# Patient Record
Sex: Female | Born: 1965 | Race: Black or African American | Hispanic: Refuse to answer | Marital: Single | Smoking: Current every day smoker
Health system: Southern US, Community
[De-identification: ages and names within clinical notes are randomized; demographics above are authoritative.]

## PROBLEM LIST (undated history)

## (undated) DIAGNOSIS — I10 Essential (primary) hypertension: Secondary | ICD-10-CM

## (undated) DIAGNOSIS — T8859XA Other complications of anesthesia, initial encounter: Secondary | ICD-10-CM

## (undated) DIAGNOSIS — T4145XA Adverse effect of unspecified anesthetic, initial encounter: Secondary | ICD-10-CM

## (undated) DIAGNOSIS — D649 Anemia, unspecified: Secondary | ICD-10-CM

## (undated) DIAGNOSIS — M1990 Unspecified osteoarthritis, unspecified site: Secondary | ICD-10-CM

## (undated) DIAGNOSIS — D219 Benign neoplasm of connective and other soft tissue, unspecified: Secondary | ICD-10-CM

## (undated) HISTORY — PX: BACK SURGERY: SHX140

## (undated) HISTORY — PX: TUBAL LIGATION: SHX77

## (undated) HISTORY — DX: Anemia, unspecified: D64.9

## (undated) HISTORY — DX: Benign neoplasm of connective and other soft tissue, unspecified: D21.9

## (undated) HISTORY — PX: CARPAL TUNNEL RELEASE: SHX101

## (undated) HISTORY — DX: Essential (primary) hypertension: I10

---

## 2017-07-12 ENCOUNTER — Encounter: Payer: Self-pay | Admitting: Obstetrics and Gynecology

## 2017-09-09 ENCOUNTER — Encounter: Payer: Self-pay | Admitting: Obstetrics and Gynecology

## 2017-09-09 ENCOUNTER — Other Ambulatory Visit (HOSPITAL_COMMUNITY)
Admission: RE | Admit: 2017-09-09 | Discharge: 2017-09-09 | Disposition: A | Discharge status: Home / Self Care | Payer: BLUE CROSS/BLUE SHIELD | Source: Ambulatory Visit | Attending: Obstetrics and Gynecology | Admitting: Obstetrics and Gynecology

## 2017-09-09 ENCOUNTER — Ambulatory Visit: Payer: BLUE CROSS/BLUE SHIELD | Admitting: Obstetrics and Gynecology

## 2017-09-09 ENCOUNTER — Other Ambulatory Visit: Payer: Self-pay

## 2017-09-09 VITALS — BP 180/100 | HR 96 | Resp 16 | Ht 63.75 in | Wt 184.0 lb

## 2017-09-09 DIAGNOSIS — N926 Irregular menstruation, unspecified: Secondary | ICD-10-CM | POA: Diagnosis not present

## 2017-09-09 DIAGNOSIS — Z23 Encounter for immunization: Secondary | ICD-10-CM

## 2017-09-09 DIAGNOSIS — N921 Excessive and frequent menstruation with irregular cycle: Secondary | ICD-10-CM

## 2017-09-09 DIAGNOSIS — D649 Anemia, unspecified: Secondary | ICD-10-CM

## 2017-09-09 DIAGNOSIS — Z01419 Encounter for gynecological examination (general) (routine) without abnormal findings: Secondary | ICD-10-CM | POA: Insufficient documentation

## 2017-09-09 HISTORY — DX: Anemia, unspecified: D64.9

## 2017-09-09 LAB — POCT URINE PREGNANCY: Preg Test, Ur: NEGATIVE

## 2017-09-09 NOTE — Progress Notes (Signed)
52 y.o. G2P0003 Single African American female here for annual exam.  Patient is having irregular menses.  Monthly menses. Has bleeding for 3 weeks at a time.  Pad change every 15 minutes.  Using 2 pads at a time.  Painful menses.  Ibuprofen does not help.   Started iron after her visit to [facility] Dept[facility] 07/31/17.  No pelvic ultrasound recently.  Had normal pelvic US in [place] about 4 years ago.   Saw at [facility]. and she was given an Rx for hormone, Provera for 10 days.   This helped bleeding somewhat.  Night sweats improved.   Did STD testing at health dept recently.   States her BP is usually elevated at MD's office.  She checks it at home - 120/68.  Just increased to Lisinopril 20 mg daily.  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   PCP:   [facility].   Patient's last menstrual period was 08/31/2017.     Period Cycle (Days): (irregular) Period Duration (Days): 3 weeks per patient  Period Pattern: (!) Irregular Menstrual Flow: Heavy(with clots) Menstrual Control: Maxi pad Menstrual Control Change Freq (Hours): 30 minutes Dysmenorrhea: (!) Severe Dysmenorrhea Symptoms: Cramping, Throbbing     Sexually active: Yes.    The current method of family planning is Tubal ligation.   Exercising: No.  The patient does not participate in regular exercise at present. Smoker:  yes  Health Maintenance: Pap:  About 4 years ago per patient.   History of abnormal Pap:  no MMG:  Scheduled 09/21/17 per patient.   Colonoscopy:  Never.   BMD:   n/a  Result  n/a TDaP:  Not up to date Gardasil:   n/a HIV and Hep C: done in the past -- negative per patient at [facility]: discuss today   reports that she has been smoking cigarettes.  She has been smoking about 0.50 packs per day. she has never used smokeless tobacco. She reports that she drinks about 1.8 oz of alcohol per week. She reports that she does not use  drugs.  Past Medical History:  Diagnosis Date  . Hypertension     Past Surgical History:  Procedure Laterality Date  . CESAREAN SECTION    . TUBAL LIGATION      Current Outpatient Medications  Medication Sig Dispense Refill  . FEROSUL 325 (65 Fe) MG tablet TK 1 T PO BID  1  . ibuprofen (ADVIL,MOTRIN) 800 MG tablet TK 1 T PO Q 8 H PRN  3  . lisinopril (PRINIVIL,ZESTRIL) 20 MG tablet TK 1 T PO D  0   No current facility-administered medications for this visit.     Family History  Problem Relation Age of Onset  . Diabetes Mother   . Hypertension Father     ROS:  Pertinent items are noted in HPI.  Otherwise, a comprehensive ROS was negative.  Exam:   BP (!) 180/100 (BP Location: Right Arm, Patient Position: Sitting, Cuff Size: Large)   Pulse 96   Resp 16   Ht 5' 3.75" (1.619 m)   Wt 184 lb (83.5 kg)   LMP 08/31/2017   BMI 31.83 kg/m     General appearance: alert, cooperative and appears stated age Head: Normocephalic, without obvious abnormality, atraumatic Neck: no adenopathy, supple, symmetrical, trachea midline and thyroid normal to inspection and palpation Lungs: clear to auscultation bilaterally Breasts: normal appearance, no masses or tenderness, No nipple retraction or  dimpling, No nipple discharge or bleeding, No axillary or supraclavicular adenopathy Heart: regular rate and rhythm Abdomen: soft, non-tender; no masses, no organomegaly Extremities: extremities normal, atraumatic, no cyanosis or edema Skin: Skin color, texture, turgor normal. No rashes or lesions Lymph nodes: Cervical, supraclavicular, and axillary nodes normal. No abnormal inguinal nodes palpated Neurologic: Grossly normal  Pelvic: External genitalia:  no lesions              Urethra:  normal appearing urethra with no masses, tenderness or lesions              Bartholins and Skenes: normal                 Vagina: normal appearing vagina with normal color and discharge, no lesions               Cervix: no lesions.  Mild vaginal bleeding.               Pap taken: Yes.   Bimanual Exam:  Uterus:  normal size, contour, position, consistency, mobility, non-tender              Adnexa: no mass, fullness, tenderness              Rectal exam: Yes.  .  Confirms.              Anus:  normal sphincter tone, no lesions  Chaperone was present for exam.  Assessment:   Well woman visit with normal exam. HTN.  Elevated today.  Menorrhagia with irregular menses.  Anemia.  Smoker.   Plan: Mammogram screening discussed. Recommended self breast awareness. Pap and HR HPV as above. Guidelines for Calcium, Vitamin D, regular exercise program including cardiovascular and weight bearing exercise. TSH, CBC.  Return for pelvic ultrasound, sonohysterogram, and EMB. TDap today. I recommended colonoscopy.  She will recheck her BP and call her PCP if it remains elevated.  Follow up annually and prn.   After visit summary provided.

## 2017-09-09 NOTE — Patient Instructions (Signed)

## 2017-09-10 LAB — CBC
HEMOGLOBIN: 10.8 g/dL — AB (ref 11.1–15.9)
Hematocrit: 33.6 % — ABNORMAL LOW (ref 34.0–46.6)
MCH: 25.5 pg — AB (ref 26.6–33.0)
MCHC: 32.1 g/dL (ref 31.5–35.7)
MCV: 79 fL (ref 79–97)
PLATELETS: 250 10*3/uL (ref 150–379)
RBC: 4.23 x10E6/uL (ref 3.77–5.28)
RDW: 21.6 % — ABNORMAL HIGH (ref 12.3–15.4)
WBC: 6.2 10*3/uL (ref 3.4–10.8)

## 2017-09-10 LAB — TSH: TSH: 2.28 u[IU]/mL (ref 0.450–4.500)

## 2017-09-11 ENCOUNTER — Telehealth: Payer: Self-pay | Admitting: Obstetrics and Gynecology

## 2017-09-11 LAB — CYTOLOGY - PAP
DIAGNOSIS: NEGATIVE
HPV (WINDOPATH): NOT DETECTED

## 2017-09-12 ENCOUNTER — Telehealth: Payer: Self-pay | Admitting: *Deleted

## 2017-09-12 NOTE — Telephone Encounter (Signed)
-----   Message from Nunzio Cobbs, MD sent at 09/11/2017  4:56 PM EST ----- Please report labs to patient.  Her hemoglobin is low at 10.8. Her iron level is back up to a normal range, but her ferritin (iron stores) are still low.  She needs to continue her iron twice daily.   Her thyroid test was normal.

## 2017-09-12 NOTE — Telephone Encounter (Signed)
Spoke with patient, advised as seen below per Dr. Quincy Simmonds. Patient verbalizes understanding and is agreeable. Will close encounter.

## 2017-09-12 NOTE — Telephone Encounter (Signed)
Notes recorded by Burnice Logan, RN on 09/12/2017 at 9:48 AM EST Left message to call Sharee Pimple at [phone].

## 2017-09-14 LAB — IRON: IRON: 27 ug/dL (ref 27–159)

## 2017-09-14 LAB — FERRITIN: FERRITIN: 12 ng/mL — AB (ref 15–150)

## 2017-09-14 LAB — SPECIMEN STATUS REPORT

## 2017-09-23 ENCOUNTER — Telehealth: Payer: Self-pay | Admitting: Obstetrics and Gynecology

## 2017-09-23 NOTE — Telephone Encounter (Signed)
Will close encounter

## 2017-09-23 NOTE — Telephone Encounter (Signed)
Patient cancelled ultrasound for 09/26/17. Would like to reschedule to later in the day if possible.

## 2017-09-23 NOTE — Telephone Encounter (Signed)
Ok for appt on 10/03/17.

## 2017-09-23 NOTE — Telephone Encounter (Signed)
Spoke with patient, states she is out of town, flight does not return until 1:30pm on 2/28, requesting to reschedule SHGM, EMB. Patient requesting afternoon appt.   LMP -08/31/17, reports irregular menses, currently on cycle.   SHGM with EMB rescheduled to 10/03/17 at 3pm, consult at 3:30pm with Dr. Quincy Simmonds. Advised patient will update and review with Dr. Quincy Simmonds and return call with any additional recommendations, patient is agreeable.   Dr. Quincy Simmonds, ok to proceed with [facility] and EMB as scheduled?

## 2017-09-26 ENCOUNTER — Other Ambulatory Visit: Payer: BLUE CROSS/BLUE SHIELD | Admitting: Obstetrics and Gynecology

## 2017-09-26 ENCOUNTER — Other Ambulatory Visit: Payer: BLUE CROSS/BLUE SHIELD

## 2017-10-03 ENCOUNTER — Other Ambulatory Visit: Payer: Self-pay

## 2017-10-03 ENCOUNTER — Other Ambulatory Visit: Payer: Self-pay | Admitting: Obstetrics and Gynecology

## 2017-10-03 ENCOUNTER — Ambulatory Visit: Payer: BLUE CROSS/BLUE SHIELD | Admitting: Obstetrics and Gynecology

## 2017-10-03 ENCOUNTER — Encounter: Payer: Self-pay | Admitting: Obstetrics and Gynecology

## 2017-10-03 ENCOUNTER — Ambulatory Visit (INDEPENDENT_AMBULATORY_CARE_PROVIDER_SITE_OTHER): Payer: BLUE CROSS/BLUE SHIELD

## 2017-10-03 VITALS — BP 160/92 | HR 84 | Resp 16 | Wt 180.0 lb

## 2017-10-03 DIAGNOSIS — D649 Anemia, unspecified: Secondary | ICD-10-CM

## 2017-10-03 DIAGNOSIS — N921 Excessive and frequent menstruation with irregular cycle: Secondary | ICD-10-CM

## 2017-10-03 DIAGNOSIS — D219 Benign neoplasm of connective and other soft tissue, unspecified: Secondary | ICD-10-CM | POA: Diagnosis not present

## 2017-10-03 NOTE — Progress Notes (Signed)
Encounter reviewed by Dr. Brook Amundson C. Silva.  

## 2017-10-03 NOTE — Progress Notes (Signed)
GYNECOLOGY  VISIT   HPI: 52 y.o.   Single  African American  female   [id number] with Patient's last menstrual period was 09/01/2017.   here for sonohysterogram/emb for menorrhagia with irregular bleeding.   Declines future childbearing.   Hgb 10.8 on 09/09/17. Stopped iron due to constipation.   Just starting a new anti HTN.  GYNECOLOGIC HISTORY: Patient's last menstrual period was 09/01/2017. Contraception:  Tubal ligation Menopausal hormone therapy:  none Last mammogram:  Not done Last pap smear:   09/09/17 Pap and HR HPV negative        OB History    Gravida Para Term Preterm AB Living   2 2 0 0 0 3   SAB TAB Ectopic Multiple Live Births   0 0 0 1 0         There are no active problems to display for this patient.   Past Medical History:  Diagnosis Date  . Hypertension     Past Surgical History:  Procedure Laterality Date  . CESAREAN SECTION    . TUBAL LIGATION      Current Outpatient Medications  Medication Sig Dispense Refill  . FEROSUL 325 (65 Fe) MG tablet TK 1 T PO BID  1  . ibuprofen (ADVIL,MOTRIN) 800 MG tablet TK 1 T PO Q 8 H PRN  3  . lisinopril (PRINIVIL,ZESTRIL) 20 MG tablet TK 1 T PO D  0  . hydrochlorothiazide (HYDRODIURIL) 25 MG tablet Take 1 tablet by mouth daily.  2   No current facility-administered medications for this visit.      ALLERGIES: Patient has no known allergies.  Family History  Problem Relation Age of Onset  . Diabetes Mother   . Hypertension Father     Social History   Socioeconomic History  . Marital status: Single    Spouse name: Not on file  . Number of children: Not on file  . Years of education: Not on file  . Highest education level: Not on file  Social Needs  . Financial resource strain: Not on file  . Food insecurity - worry: Not on file  . Food insecurity - inability: Not on file  . Transportation needs - medical: Not on file  . Transportation needs - non-medical: Not on file  Occupational History  .  Not on file  Tobacco Use  . Smoking status: Current Every Day Smoker    Packs/day: 0.50    Types: Cigarettes  . Smokeless tobacco: Never Used  Substance and Sexual Activity  . Alcohol use: Yes    Alcohol/week: 1.8 oz    Types: 3 Shots of liquor per week    Comment: occ on weekends  . Drug use: No  . Sexual activity: Yes    Birth control/protection: None  Other Topics Concern  . Not on file  Social History Narrative  . Not on file    ROS:  Pertinent items are noted in HPI.  PHYSICAL EXAMINATION:    BP (!) 160/92 (BP Location: Left Arm, Patient Position: Sitting, Cuff Size: Normal)   Pulse 84   Resp 16   Wt 180 lb (81.6 kg)   LMP 09/01/2017   BMI 31.14 kg/m     General appearance: alert, cooperative and appears stated age   Pelvic US 4 fibroids, one submucous measuring 20 x 14 mm.  EMS 5.89 mm. Normal ovaries.  No free fluid.  Sonohysterogram and EMB Consent for procedures.  Sterile prep with Hibiclens. Cannula placed.  Sterile saline  injected.  Submucous fibroid noted.   EMB Sterile prep with Hibiclens.  Pipelle placed to 6 cm twice.  Tissue to pathology.  No complications.  Minimal EBL.  Chaperone was present for exam.  ASSESSMENT  Uterine fibroids, including submucous fibroid. Status post Cesarean Section.  Status post BTL. HTN.  Not controlled. On new medication.  Anemia. Off Fe.  PLAN  We discussed fibroids and medical versus surgical care.   She declines conservative medical therapy or surgical therapy and desires hyserectomy for definitive tx of fibroids and bleeding.   We discussed laparoscopic hysterectomy with bilateral salpingectomy and bilateral oophorectomy only if the ovaries are abnormal.    I reviewed risks and benefits of surgery.  Risks include but are not limited to bleeding, infection, damage to surrounding organs, pneumonia, reaction to anesthesia, DVT, PE, death, need for reoperation, hernia formation, vaginal cuff  dehiscence.   FU EMB.  Instructions/precautions given.  Take iron with orange juice bid.  She knows she needs to have her BP in control prior to surgery being done.   An After Visit Summary was printed and given to the patient.  __25____ minutes face to face time of which over 50% was spent in counseling.

## 2017-10-03 NOTE — Patient Instructions (Addendum)
Total Laparoscopic Hysterectomy A total laparoscopic hysterectomy is a minimally invasive surgery to remove your uterus and cervix. This surgery is performed by making several small cuts (incisions) in your abdomen. It can also be done with a thin, lighted tube (laparoscope) inserted into two small incisions in your lower abdomen. Your fallopian tubes and ovaries can be removed (bilateral salpingo-oophorectomy) during this surgery as well.Benefits of minimally invasive surgery include:  Less pain.  Less risk of blood loss.  Less risk of infection.  Quicker return to normal activities.  Tell a health care provider about:  Any allergies you have.  All medicines you are taking, including vitamins, herbs, eye drops, creams, and over-the-counter medicines.  Any problems you or family members have had with anesthetic medicines.  Any blood disorders you have.  Any surgeries you have had.  Any medical conditions you have. What are the risks? Generally, this is a safe procedure. However, as with any procedure, complications can occur. Possible complications include:  Bleeding.  Blood clots in the legs or lung.  Infection.  Injury to surrounding organs.  Problems with anesthesia.  Early menopause symptoms (hot flashes, night sweats, insomnia).  Risk of conversion to an open abdominal incision.  What happens before the procedure?  Ask your health care provider about changing or stopping your regular medicines.  Do not take aspirin or blood thinners (anticoagulants) for 1 week before the surgery or as told by your health care provider.  Do not eat or drink anything for 8 hours before the surgery or as told by your health care provider.  Quit smoking if you smoke.  Arrange for a ride home after surgery and for someone to help you at home during recovery. What happens during the procedure?  You will be given antibiotic medicine.  An IV tube will be placed in your arm. You  will be given medicine to make you sleep (general anesthetic).  A gas (carbon dioxide) will be used to inflate your abdomen. This will allow your surgeon to look inside your abdomen, perform your surgery, and treat any other problems found if necessary.  Three or four small incisions (often less than 1/2 inch) will be made in your abdomen. One of these incisions will be made in the area of your belly button (navel). The laparoscope will be inserted into the incision. Your surgeon will look through the laparoscope while doing your procedure.  Other surgical instruments will be inserted through the other incisions.  Your uterus may be removed through your vagina or cut into small pieces and removed through the small incisions.  Your incisions will be closed. What happens after the procedure?  The gas will be released from inside your abdomen.  You will be taken to the recovery area where a nurse will watch and check your progress. Once you are awake, stable, and taking fluids well, without other problems, you will return to your room or be allowed to go home.  There is usually minimal discomfort following the surgery because the incisions are so small.  You will be given pain medicine while you are in the hospital and for when you go home. This information is not intended to replace advice given to you by your health care provider. Make sure you discuss any questions you have with your health care provider. Document Released: 05/13/2007 Document Revised: 12/22/2015 Document Reviewed: 02/03/2013 Elsevier Interactive Patient Education  2017 Elsevier Inc.  Endometrial Biopsy, Care After This sheet gives you information about how to  care for yourself after your procedure. Your health care provider may also give you more specific instructions. If you have problems or questions, contact your health care provider. What can I expect after the procedure? After the procedure, it is common to  have:  Mild cramping.  A small amount of vaginal bleeding for a few days. This is normal.  Follow these instructions at home:  Take over-the-counter and prescription medicines only as told by your health care provider.  Do not douche, use tampons, or have sexual intercourse until your health care provider approves.  Return to your normal activities as told by your health care provider. Ask your health care provider what activities are safe for you.  Follow instructions from your health care provider about any activity restrictions, such as restrictions on strenuous exercise or heavy lifting. Contact a health care provider if:  You have heavy bleeding, or bleed for longer than 2 days after the procedure.  You have bad smelling discharge from your vagina.  You have a fever or chills.  You have a burning sensation when urinating or you have difficulty urinating.  You have severe pain in your lower abdomen. Get help right away if:  You have severe cramps in your stomach or back.  You pass large blood clots.  Your bleeding increases.  You become weak or light-headed, or you pass out. Summary  After the procedure, it is common to have mild cramping and a small amount of vaginal bleeding for a few days.  Do not douche, use tampons, or have sexual intercourse until your health care provider approves.  Return to your normal activities as told by your health care provider. Ask your health care provider what activities are safe for you. This information is not intended to replace advice given to you by your health care provider. Make sure you discuss any questions you have with your health care provider. Document Released: 05/06/2013 Document Revised: 08/01/2016 Document Reviewed: 08/01/2016 Elsevier Interactive Patient Education  2017 [employer].

## 2017-10-04 DIAGNOSIS — D219 Benign neoplasm of connective and other soft tissue, unspecified: Secondary | ICD-10-CM | POA: Insufficient documentation

## 2017-10-04 DIAGNOSIS — D649 Anemia, unspecified: Secondary | ICD-10-CM | POA: Insufficient documentation

## 2017-10-08 ENCOUNTER — Telehealth: Payer: Self-pay | Admitting: Obstetrics and Gynecology

## 2017-10-08 NOTE — Telephone Encounter (Signed)
Spoke with patient regarding benefit for surgery. Patient understood and agreeable. Patient aware this is professional benefit only. Patient aware will be contacted by hospital for separate benefits. Patient advises she will call back to confirm and proceed with scheduling ° ° cc: Sally Yeakley, RN °

## 2017-10-08 NOTE — Telephone Encounter (Signed)
-----   Message from Nunzio Cobbs, MD sent at 10/07/2017  5:20 PM EDT ----- Please inform patient of her EMB showing benign endometrium.  She has fibroids and is considering laparoscopic hysterectomy with bilateral salpingectomy.  Please let me know how I can help further.  Cc- Lamont Snowball

## 2017-10-08 NOTE — Telephone Encounter (Signed)
Call to patient to review results. Left message calling in addition to Natasha Mcintyre's call. Nothing wrong. Call back at her convenience.

## 2017-10-08 NOTE — Telephone Encounter (Signed)
Call placed to patient to review benefits for a recommended surgery. Left voicemail message requesting a return call. ° ° cc: Sally Yeakley, RN °

## 2017-10-08 NOTE — Telephone Encounter (Signed)
Return call from patient. Advised of results as directed by Dr Quincy Simmonds. States she is interested in proceeding with surgery. Would like benefit information to help with decision. Advised will have business office call her back.

## 2017-10-10 NOTE — Telephone Encounter (Signed)
Patient called requesting to speak with Natasha Mcintyre again about her surgery benefits.

## 2017-10-11 ENCOUNTER — Other Ambulatory Visit: Payer: Self-pay | Admitting: Obstetrics and Gynecology

## 2017-10-11 DIAGNOSIS — Z1231 Encounter for screening mammogram for malignant neoplasm of breast: Secondary | ICD-10-CM

## 2017-10-11 NOTE — Telephone Encounter (Signed)
Patient called requesting to speak with [facility].

## 2017-10-14 NOTE — Telephone Encounter (Signed)
Spoke with patient a second time to regarding benefits. Address all questions and concerns regarding benefits. Patient advises she will call back when she is ready to proceed with scheduling   cc: Lamont Snowball, RN

## 2017-10-18 ENCOUNTER — Ambulatory Visit
Admission: RE | Admit: 2017-10-18 | Discharge: 2017-10-18 | Disposition: A | Discharge status: Home / Self Care | Payer: BLUE CROSS/BLUE SHIELD | Source: Ambulatory Visit | Attending: Obstetrics and Gynecology | Admitting: Obstetrics and Gynecology

## 2017-10-18 DIAGNOSIS — Z1231 Encounter for screening mammogram for malignant neoplasm of breast: Secondary | ICD-10-CM

## 2017-11-08 ENCOUNTER — Encounter: Payer: Self-pay | Admitting: Obstetrics and Gynecology

## 2017-11-08 NOTE — Telephone Encounter (Signed)
Call to patient to follow-up on plan for surgery.  States she has just bought a house and financially cant proceed with surgery right now.  Interested in other options. Advised office visit recommended to discuss.  Patient unable to talk. Will call back to schedule office visit.  Routing to provider for final review. Patient agreeable to disposition. Will close encounter.

## 2017-11-11 ENCOUNTER — Encounter (INDEPENDENT_AMBULATORY_CARE_PROVIDER_SITE_OTHER): Payer: Self-pay | Admitting: Physician Assistant

## 2017-11-11 ENCOUNTER — Other Ambulatory Visit: Payer: Self-pay | Admitting: Obstetrics and Gynecology

## 2017-11-11 ENCOUNTER — Telehealth: Payer: Self-pay | Admitting: Obstetrics and Gynecology

## 2017-11-11 ENCOUNTER — Ambulatory Visit (INDEPENDENT_AMBULATORY_CARE_PROVIDER_SITE_OTHER): Payer: BLUE CROSS/BLUE SHIELD | Admitting: Physician Assistant

## 2017-11-11 ENCOUNTER — Ambulatory Visit (INDEPENDENT_AMBULATORY_CARE_PROVIDER_SITE_OTHER): Payer: BLUE CROSS/BLUE SHIELD

## 2017-11-11 VITALS — Ht 63.75 in | Wt 180.0 lb

## 2017-11-11 DIAGNOSIS — D649 Anemia, unspecified: Secondary | ICD-10-CM

## 2017-11-11 DIAGNOSIS — M5441 Lumbago with sciatica, right side: Secondary | ICD-10-CM | POA: Diagnosis not present

## 2017-11-11 DIAGNOSIS — M5442 Lumbago with sciatica, left side: Secondary | ICD-10-CM

## 2017-11-11 MED ORDER — CYCLOBENZAPRINE HCL 10 MG PO TABS: 10.0000 mg | ORAL_TABLET | Freq: Every day | ORAL | 0 refills | Status: DC

## 2017-11-11 MED ORDER — METHYLPREDNISOLONE 4 MG PO TABS: ORAL_TABLET | ORAL | 0 refills | Status: DC

## 2017-11-11 NOTE — Progress Notes (Signed)
Office Visit Note   Patient: Natasha Mcintyre           Date of Birth: 19-Apr-1966           MRN: [id number] Visit Date: 11/11/2017              Requested by: Department, [facility] [place], [place] [address] PCP: Department, [place]: Visit Diagnoses:  1. Acute midline low back pain with bilateral sciatica     Plan: We will have her go on a Medrol Dosepak no NSAIDs while on this.  Also take Flexeril at night.  Moist heat to the back.  She will continue her back exercises.  We will see her back in 2 weeks check her progress lack of.  If she continues to have low back pain with radicular symptoms down both legs and will most likely order an MRI to rule out HNP.  Questions were encouraged and answered at length today.  Follow-Up Instructions: Return in about 2 weeks (around 11/25/2017).   Orders:  Orders Placed This Encounter  Procedures  . XR Lumbar Spine 2-3 Views   Meds ordered this encounter  Medications  . methylPREDNISolone (MEDROL) 4 MG tablet    Sig: Take as directed    Dispense:  21 tablet    Refill:  0  . cyclobenzaprine (FLEXERIL) 10 MG tablet    Sig: Take 1 tablet (10 mg total) by mouth at bedtime.    Dispense:  30 tablet    Refill:  0      Procedures: No procedures performed   Clinical Data: No additional findings.   Subjective: Chief Complaint  Patient presents with  . Lower Back - Pain    Numbness and  bilateral legs x 1-2 months    HPI Natasha Mcintyre is a 52 year old female comes in today with low back pain and radicular symptoms down both legs.  She states she has had back pain for at least the past 6 years.  She is from [place] been treated up there for low back pain with physical therapy.  She states that her back pain is changed though over the last 2 months and now she is having difficulty sleeping due to the pain in her legs.  She also has some numbness that goes down both  legs into her feet at times.  She has pain with prolonged standing primarily the lateral aspect of the left lower leg.  She has been taking ibuprofen without any significant relief.  She has had no bowel bladder dysfunction.  Review of Systems Denies any fevers chills shortness of breath chest pain.  Objective: Vital Signs: Ht 5' 3.75" (1.619 m)   Wt 180 lb (81.6 kg)   BMI 31.14 kg/m   Physical Exam  Constitutional: She is oriented to person, place, and time. She appears well-developed and well-nourished. No distress.  Cardiovascular: Intact distal pulses.  Pulmonary/Chest: Effort normal.  Neurological: She is alert and oriented to person, place, and time.  Skin: She is not diaphoretic.  Psychiatric: She has a normal mood and affect.    Ortho Exam 5 out of 5 strength lower extremities throughout against resistance.  Sensation grossly intact bilateral feet.  +1 deep tendon reflexes at the knees and ankles and equal and symmetric.  Negative straight leg raise bilaterally.  She is able to touch her toes.  She has good extension of lumbar spine without pain.  She is able  to walk on her tiptoes and heels.  She has tenderness over the lower lumbar spinal column. Specialty Comments:  No specialty comments available.  Imaging: Xr Lumbar Spine 2-3 Views  Result Date: 11/11/2017 Lumbar spine 2 views: No acute fractures.  Grade 1 spinal listhesis L4 on 5.  Degenerative disc disease at L5 4 5 and L5-S1.  Remainder of the spine without significant degenerative changes.  Normal lordotic curvature.    PMFS History: Patient Active Problem List   Diagnosis Date Noted  . Fibroids 10/04/2017  . Anemia 10/04/2017   Past Medical History:  Diagnosis Date  . Anemia 09/09/2017   hgb 10.8  . Fibroids   . Hypertension     Family History  Problem Relation Age of Onset  . Diabetes Mother   . Hypertension Father     Past Surgical History:  Procedure Laterality Date  . CESAREAN SECTION    .  TUBAL LIGATION     Social History   Occupational History  . Not on file  Tobacco Use  . Smoking status: Current Every Day Smoker    Packs/day: 0.50    Types: Cigarettes  . Smokeless tobacco: Never Used  Substance and Sexual Activity  . Alcohol use: Yes    Alcohol/week: 1.8 oz    Types: 3 Shots of liquor per week    Comment: occ on weekends  . Drug use: No  . Sexual activity: Yes    Birth control/protection: None

## 2017-11-11 NOTE — Telephone Encounter (Signed)
Spoke with patient. Patient states that she had her labs rechecked at a recent visit at the [facility]. States she will have results faxed over to the office. Patient does want to schedule an appointment with Dr.Silva to discuss alternative options to hysterectomy. Unable to schedule at this time. Will return call.

## 2017-11-11 NOTE — Telephone Encounter (Signed)
Please contact patient to schedule follow up blood work.  She is anemic and needs a recheck in 1 month.  She has declined hysterectomy and is considering other options.  Please schedule a recheck with me if she desires to do this at time of her lab visit.

## 2017-11-21 NOTE — Telephone Encounter (Signed)
Left message to call Kaitlyn at [phone]. 

## 2017-11-25 ENCOUNTER — Ambulatory Visit (INDEPENDENT_AMBULATORY_CARE_PROVIDER_SITE_OTHER): Payer: BLUE CROSS/BLUE SHIELD | Admitting: Physician Assistant

## 2017-11-25 ENCOUNTER — Ambulatory Visit (INDEPENDENT_AMBULATORY_CARE_PROVIDER_SITE_OTHER): Payer: BLUE CROSS/BLUE SHIELD

## 2017-11-25 ENCOUNTER — Encounter (INDEPENDENT_AMBULATORY_CARE_PROVIDER_SITE_OTHER): Payer: Self-pay | Admitting: Physician Assistant

## 2017-11-25 VITALS — Ht 63.75 in | Wt 180.0 lb

## 2017-11-25 DIAGNOSIS — G5601 Carpal tunnel syndrome, right upper limb: Secondary | ICD-10-CM | POA: Diagnosis not present

## 2017-11-25 DIAGNOSIS — M546 Pain in thoracic spine: Secondary | ICD-10-CM

## 2017-11-25 DIAGNOSIS — M5442 Lumbago with sciatica, left side: Secondary | ICD-10-CM | POA: Diagnosis not present

## 2017-11-25 DIAGNOSIS — G5602 Carpal tunnel syndrome, left upper limb: Secondary | ICD-10-CM | POA: Diagnosis not present

## 2017-11-25 DIAGNOSIS — M5441 Lumbago with sciatica, right side: Secondary | ICD-10-CM

## 2017-11-25 NOTE — Addendum Note (Signed)
Addended by: Marlyne Beards on: 11/25/2017 04:49 PM   Modules accepted: Orders

## 2017-11-25 NOTE — Progress Notes (Signed)
Office Visit Note   Patient: Natasha Mcintyre           Date of Birth: 1966-07-24           MRN: [id number] Visit Date: 11/25/2017              Requested by: Department, [facility] [place], [place] [address] PCP: Department, [place]: Visit Diagnoses:  1. Acute midline thoracic back pain   2. Acute midline low back pain with bilateral sciatica   3. Carpal tunnel syndrome, left upper limb   4. Carpal tunnel syndrome, right upper limb     Plan: We will have her follow-up after the MRI of her lumbar spine thoracic spine.  We will obtain both of these will out HP and sores pain.  Regards to carpal tunnel syndrome and symptoms bilaterally this point time have her take vitamin B 600 mg twice daily.  May consider nerve conduction and EMG studies in the future.  Questions are encouraged and answered at length.  Follow-Up Instructions: Return for after MRI.   Orders:  Orders Placed This Encounter  Procedures  . XR Thoracic Spine 2 View   No orders of the defined types were placed in this encounter.     Procedures: No procedures performed   Clinical Data: No additional findings.   Subjective: Chief Complaint  Patient presents with  . Lower Back - Pain, Follow-up    HPI Natasha Mcintyre returns today for follow-up of her low back pain.  She states she had no relief with the Medrol Dosepak.  She continues to have numbness down both legs into her feet left is somewhat worse than the right.  She also notes that she is having some upper thoracic pain between shoulder blades that radiates into her chest times.  States his thoracic pain occurs mostly whenever she is lying down.  She has no radicular symptoms into either arm but she does have some numbness tingling in both hands whenever she is driving a car does awaken her and she has to shake her hands this been ongoing for years.  Pain in the upper thoracic region is  been ongoing for the past month.  She denies any shortness of breath or orthopnea.  She states she does not have any reflux or heartburn. Review of Systems Negative for shortness of breath, fevers, chills, nausea vomiting, orthopnea, dysphagia.  Please see HPI otherwise negative.  Objective: Vital Signs: Ht 5' 3.75" (1.619 m)   Wt 180 lb (81.6 kg)   BMI 31.14 kg/m   Physical Exam Well-developed well-nourished female in no acute distress mood and affect appropriate.  Psych alert x3. Ortho Exam Upper extremity she has 5 out of 5 strength throughout against resistance.  Full motor full sensation bilateral hands.  Positive Tinel's at the wrist bilaterally.  Negative Phalen's and compression over the median nerve bilaterally.  Good range of motion the cervical spine without pain.  Nontender over the cervical spine nontender over the medial borders of the scapula bilaterally.  Has tenderness over the mid T-spine.  Lumbar spine tenderness left paraspinous region and also over the lower lumbar spine,.  Negative straight leg raise bilaterally.  She has full forward flexion lumbar spine limited extension with discomfort. Specialty Comments:  No specialty comments available.  Imaging: Xr Thoracic Spine 2 View  Result Date: 11/25/2017 Thoracic spine AP lateral views: No acute fracture.  Slight scoliosis of the mid T-spine.  Otherwise the space well maintained.  No spondylolisthesis.  No bony abnormalities otherwise.    PMFS History: Patient Active Problem List   Diagnosis Date Noted  . Fibroids 10/04/2017  . Anemia 10/04/2017   Past Medical History:  Diagnosis Date  . Anemia 09/09/2017   hgb 10.8  . Fibroids   . Hypertension     Family History  Problem Relation Age of Onset  . Diabetes Mother   . Hypertension Father     Past Surgical History:  Procedure Laterality Date  . CESAREAN SECTION    . TUBAL LIGATION     Social History   Occupational History  . Not on file  Tobacco Use    . Smoking status: Current Every Day Smoker    Packs/day: 0.50    Types: Cigarettes  . Smokeless tobacco: Never Used  Substance and Sexual Activity  . Alcohol use: Yes    Alcohol/week: 1.8 oz    Types: 3 Shots of liquor per week    Comment: occ on weekends  . Drug use: No  . Sexual activity: Yes    Birth control/protection: None

## 2017-11-26 NOTE — Telephone Encounter (Signed)
Left message to call Kaitlyn at [phone]. 

## 2017-12-03 NOTE — Telephone Encounter (Signed)
Dr.Silva, I have attempted to reach this patient x 2 with no return call. Please advise.

## 2017-12-03 NOTE — Telephone Encounter (Signed)
I closed this encounter. We will wait for the patient to return our call.

## 2017-12-24 ENCOUNTER — Ambulatory Visit
Admission: RE | Admit: 2017-12-24 | Discharge: 2017-12-24 | Disposition: A | Discharge status: Home / Self Care | Payer: BLUE CROSS/BLUE SHIELD | Source: Ambulatory Visit | Attending: Physician Assistant | Admitting: Physician Assistant

## 2017-12-24 DIAGNOSIS — M5442 Lumbago with sciatica, left side: Principal | ICD-10-CM

## 2017-12-24 DIAGNOSIS — M546 Pain in thoracic spine: Secondary | ICD-10-CM

## 2017-12-24 DIAGNOSIS — M5441 Lumbago with sciatica, right side: Secondary | ICD-10-CM

## 2018-01-01 ENCOUNTER — Ambulatory Visit (INDEPENDENT_AMBULATORY_CARE_PROVIDER_SITE_OTHER): Payer: BLUE CROSS/BLUE SHIELD | Admitting: Physician Assistant

## 2018-01-01 ENCOUNTER — Encounter (INDEPENDENT_AMBULATORY_CARE_PROVIDER_SITE_OTHER): Payer: Self-pay | Admitting: Physician Assistant

## 2018-01-01 DIAGNOSIS — M48062 Spinal stenosis, lumbar region with neurogenic claudication: Secondary | ICD-10-CM | POA: Diagnosis not present

## 2018-01-01 DIAGNOSIS — M5442 Lumbago with sciatica, left side: Secondary | ICD-10-CM | POA: Diagnosis not present

## 2018-01-01 DIAGNOSIS — M5441 Lumbago with sciatica, right side: Secondary | ICD-10-CM | POA: Diagnosis not present

## 2018-01-01 MED ORDER — HYDROCODONE-ACETAMINOPHEN 5-325 MG PO TABS: 1.0000 | ORAL_TABLET | Freq: Four times a day (QID) | ORAL | 0 refills | Status: DC | PRN

## 2018-01-01 NOTE — Progress Notes (Signed)
HPI: Mrs. Duthie returns today to go over the results of her thoracic and lumbar MRIs.  She continues to have numbness down both legs.  Pain continues to be somewhat worse on the right than the left.  She also continues to have pain in the upper thoracic region.  Said no bowel bladder dysfunction.  MRI results were reviewed going over the images with the patient today.  Thoracic spine MRI dated 12/24/2017 showed degenerative spondylolisthesis with mild disc bulging and a small disc protrusion at T4-T5 through T a T9 no significant spinal stenosis.  Facet arthritic changes at T3-4 T4-5 causing mild bilateral foraminal stenosis. Lumbar spine MRI also dated 12/24/2017 showed a 9 mm anterolisthesis of L4 on L5 causing severe canal stenosis.  Severe bilateral facet arthropathy resulting in severe canal and bilateral subarticular stenosis with moderate bilateral L4 foraminal narrowing.  Severe facet arthritic changes at L5-S1 causing mild to moderate bilateral L5 foraminal stenosis right greater than left.  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  Did give her Norco 5/325 mg #30 with no refill she is to use it sparingly.  Questions were encouraged and answered at length.

## 2018-02-04 ENCOUNTER — Telehealth: Payer: Self-pay | Admitting: Obstetrics and Gynecology

## 2018-02-04 NOTE — Telephone Encounter (Signed)
Patient states that she needs a refill on her blood pressure medication.

## 2018-02-04 NOTE — Telephone Encounter (Signed)
Spoke with patient. Patient initially request refill of BP medication lisinopril and hydrochlorothiazide. Patient states she has this filled at [facility], where she is seen for her BP, was unaware she called the wrong location. Patient will contact [facility] for refills, thankful for return call.   Routing to provider for final review. Patient is agreeable to disposition. Will close encounter.

## 2018-03-24 ENCOUNTER — Other Ambulatory Visit: Payer: Self-pay | Admitting: Neurosurgery

## 2018-03-24 ENCOUNTER — Telehealth (INDEPENDENT_AMBULATORY_CARE_PROVIDER_SITE_OTHER): Payer: Self-pay | Admitting: Physician Assistant

## 2018-03-24 MED ORDER — HYDROCODONE-ACETAMINOPHEN 5-325 MG PO TABS: 1.0000 | ORAL_TABLET | Freq: Four times a day (QID) | ORAL | 0 refills | Status: DC | PRN

## 2018-03-24 NOTE — Telephone Encounter (Signed)
Patient called needing Rx refilled (Hydrocodone)  until her surgery The number to contact patient  Is [phone]

## 2018-03-24 NOTE — Telephone Encounter (Signed)
Please advise 

## 2018-03-24 NOTE — Telephone Encounter (Signed)
At this point we will refill her pain medication one more time but after this since she is actually having surgery by 1 of the neurosurgeons in town in November she is to obtain any further medications from them.

## 2018-03-25 NOTE — Telephone Encounter (Signed)
LMOM for patient of the below message  

## 2018-04-21 ENCOUNTER — Other Ambulatory Visit: Payer: Self-pay | Admitting: Neurosurgery

## 2018-04-28 NOTE — Pre-Procedure Instructions (Signed)
[facility] [place]  04/28/2018      [facility] #[id number] [address], [place] [address] [place] [address] Phone: [phone] Fax: [phone]    Your procedure is scheduled on October 10th.  Report to [facility] Admitting at 1045 A.M.  Call this number if you have problems the morning of surgery:  [phone]   Remember:  Do not eat or drink after midnight.      Take these medicines the morning of surgery with A SIP OF WATER   Hydrocodone (if needed)  7 days prior to surgery STOP taking any Aspirin(unless otherwise instructed by your surgeon), Aleve, Naproxen, Ibuprofen, Motrin, Advil, Goody's, BC's, all herbal medications, fish oil, and all vitamins     Do not wear jewelry, make-up or nail polish.  Do not wear lotions, powders, or perfumes, or deodorant.  Do not shave 48 hours prior to surgery.  Men may shave face and neck.  Do not bring valuables to the hospital.  [facility] is not responsible for any belongings or valuables.  Contacts, dentures or bridgework may not be worn into surgery.  Leave your suitcase in the car.  After surgery it may be brought to your room.  For patients admitted to the hospital, discharge time will be determined by your treatment team.  Patients discharged the day of surgery will not be allowed to drive home.    [facility]- Preparing For Surgery  Before surgery, you can play an important role. Because skin is not sterile, your skin needs to be as free of germs as possible. You can reduce the number of germs on your skin by washing with CHG (chlorahexidine gluconate) Soap before surgery.  CHG is an antiseptic cleaner which kills germs and bonds with the skin to continue killing germs even after washing.    Oral Hygiene is also important to reduce your risk of infection.  Remember - BRUSH YOUR TEETH THE MORNING OF SURGERY WITH YOUR REGULAR  TOOTHPASTE  Please do not use if you have an allergy to CHG or antibacterial soaps. If your skin becomes reddened/irritated stop using the CHG.  Do not shave (including legs and underarms) for at least 48 hours prior to first CHG shower. It is OK to shave your face.  Please follow these instructions carefully.   1. Shower the NIGHT BEFORE SURGERY and the MORNING OF SURGERY with CHG.   2. If you chose to wash your hair, wash your hair first as usual with your normal shampoo.  3. After you shampoo, rinse your hair and body thoroughly to remove the shampoo.  4. Use CHG as you would any other liquid soap. You can apply CHG directly to the skin and wash gently with a scrungie or a clean washcloth.   5. Apply the CHG Soap to your body ONLY FROM THE NECK DOWN.  Do not use on open wounds or open sores. Avoid contact with your eyes, ears, mouth and genitals (private parts). Wash Face and genitals (private parts)  with your normal soap.  6. Wash thoroughly, paying special attention to the area where your surgery will be performed.  7. Thoroughly rinse your body with warm water from the neck down.  8. DO NOT shower/wash with your normal soap after using and rinsing off the CHG Soap.  9. Pat yourself dry with a CLEAN TOWEL.  10. Wear CLEAN PAJAMAS to bed  the night before surgery, wear comfortable clothes the morning of surgery  11. Place CLEAN SHEETS on your bed the night of your first shower and DO NOT SLEEP WITH PETS.    Day of Surgery:  Do not apply any deodorants/lotions.  Please wear clean clothes to the hospital/surgery center.   Remember to brush your teeth WITH YOUR REGULAR TOOTHPASTE.    Please read over the following fact sheets that you were given.

## 2018-04-29 ENCOUNTER — Encounter (HOSPITAL_COMMUNITY)
Admission: RE | Admit: 2018-04-29 | Discharge: 2018-04-29 | Disposition: A | Discharge status: Home / Self Care | Payer: BLUE CROSS/BLUE SHIELD | Source: Ambulatory Visit | Attending: Neurosurgery | Admitting: Neurosurgery

## 2018-04-29 ENCOUNTER — Other Ambulatory Visit: Payer: Self-pay

## 2018-04-29 ENCOUNTER — Encounter (HOSPITAL_COMMUNITY): Payer: Self-pay

## 2018-04-29 DIAGNOSIS — Z01818 Encounter for other preprocedural examination: Secondary | ICD-10-CM | POA: Insufficient documentation

## 2018-04-29 HISTORY — DX: Adverse effect of unspecified anesthetic, initial encounter: T41.45XA

## 2018-04-29 HISTORY — DX: Unspecified osteoarthritis, unspecified site: M19.90

## 2018-04-29 HISTORY — DX: Other complications of anesthesia, initial encounter: T88.59XA

## 2018-04-29 LAB — CBC
HCT: 39.3 % (ref 36.0–46.0)
Hemoglobin: 12.2 g/dL (ref 12.0–15.0)
MCH: 28.6 pg (ref 26.0–34.0)
MCHC: 31 g/dL (ref 30.0–36.0)
MCV: 92.3 fL (ref 78.0–100.0)
PLATELETS: 221 10*3/uL (ref 150–400)
RBC: 4.26 MIL/uL (ref 3.87–5.11)
RDW: 14.4 % (ref 11.5–15.5)
WBC: 7.8 10*3/uL (ref 4.0–10.5)

## 2018-04-29 LAB — BASIC METABOLIC PANEL
ANION GAP: 7 (ref 5–15)
BUN: 14 mg/dL (ref 6–20)
CO2: 25 mmol/L (ref 22–32)
Calcium: 9.4 mg/dL (ref 8.9–10.3)
Chloride: 105 mmol/L (ref 98–111)
Creatinine, Ser: 0.92 mg/dL (ref 0.44–1.00)
Glucose, Bld: 103 mg/dL — ABNORMAL HIGH (ref 70–99)
POTASSIUM: 4 mmol/L (ref 3.5–5.1)
SODIUM: 137 mmol/L (ref 135–145)

## 2018-04-29 LAB — TYPE AND SCREEN
ABO/RH(D): A POS
ANTIBODY SCREEN: NEGATIVE

## 2018-04-29 LAB — ABO/RH: ABO/RH(D): A POS

## 2018-04-29 LAB — SURGICAL PCR SCREEN
MRSA, PCR: NEGATIVE
STAPHYLOCOCCUS AUREUS: POSITIVE — AB

## 2018-04-29 NOTE — Progress Notes (Signed)
PCP is at [facility].  LOV 12/2017 Denies murmur, cp, sob,  No cardiac testing or issues. Has never really had general anesthesia

## 2018-04-29 NOTE — Pre-Procedure Instructions (Signed)
[facility] [place]  04/29/2018      [facility] DR[facility]ORE #[id number] [address], [place] - [address] AT [place] [address] Phone: [phone] Fax: [phone]    Your procedure is scheduled on  Thursday, October 10th.   Report to [facility] Admitting at 1045 A.M.             (posted surgery time 12:45p - 4:17p)   Call this number if you have problems the morning of surgery:  [phone]   Remember:  Do not eat any foods or drink any liquids after midnight.      Take these medicines the morning of surgery with A SIP OF WATER   Hydrocodone (if needed)  7 days prior to surgery STOP taking any Aspirin(unless otherwise instructed by your surgeon), Aleve, Naproxen, Ibuprofen, Motrin, Advil, Goody's, BC's, all herbal medications, fish oil, and all vitamins     Do not wear jewelry, make-up or nail polish.  Do not wear lotions, powders, or perfumes, or deodorant.  Do not shave 48 hours prior to surgery.  Men may shave face and neck.  Do not bring valuables to the hospital.  [facility] is not responsible for any belongings or valuables.  Contacts, dentures or bridgework may not be worn into surgery.  Leave your suitcase in the car.  After surgery it may be brought to your room.  For patients admitted to the hospital, discharge time will be determined by your treatment team.      [facility]- Preparing For Surgery  Before surgery, you can play an important role. Because skin is not sterile, your skin needs to be as free of germs as possible. You can reduce the number of germs on your skin by washing with CHG (chlorahexidine gluconate) Soap before surgery.  CHG is an antiseptic cleaner which kills germs and bonds with the skin to continue killing germs even after washing.    Oral Hygiene is also important to reduce your risk of infection.  Remember - BRUSH YOUR TEETH THE MORNING OF SURGERY WITH YOUR REGULAR  TOOTHPASTE  Please do not use if you have an allergy to CHG or antibacterial soaps. If your skin becomes reddened/irritated stop using the CHG.  Do not shave (including legs and underarms) for at least 48 hours prior to first CHG shower. It is OK to shave your face.  Please follow these instructions carefully.   1. Shower the NIGHT BEFORE SURGERY and the MORNING OF SURGERY with CHG.   2. If you chose to wash your hair, wash your hair first as usual with your normal shampoo.  3. After you shampoo, rinse your hair and body thoroughly to remove the shampoo.  4. Use CHG as you would any other liquid soap. You can apply CHG directly to the skin and wash gently with a scrungie or a clean washcloth.   5. Apply the CHG Soap to your body ONLY FROM THE NECK DOWN.  Do not use on open wounds or open sores. Avoid contact with your eyes, ears, mouth and genitals (private parts). Wash Face and genitals (private parts)  with your normal soap.  6. Wash thoroughly, paying special attention to the area where your surgery will be performed.  7. Thoroughly rinse your body with warm water from the neck down.  8. DO NOT shower/wash with your normal soap after using and rinsing off the CHG Soap.  9.  Pat yourself dry with a CLEAN TOWEL.  10. Wear CLEAN PAJAMAS to bed the night before surgery, wear comfortable clothes the morning of surgery  11. Place CLEAN SHEETS on your bed the night of your first shower and DO NOT SLEEP WITH PETS.    Day of Surgery:  Do not apply any deodorants/lotions.  Please wear clean clothes to the hospital/surgery center.   Remember to brush your teeth WITH YOUR REGULAR TOOTHPASTE.    Please read over the following fact sheets that you were given.

## 2018-04-30 NOTE — Progress Notes (Signed)
Mupirocin Ointment Rx called into [facility] on [address] at 7:30 AM for positive PCR of Staph. Left message on pt's voicemail informing her of the results and need to pick up Rx. Asked to call me back to let me know she got the message. Pt called about 10 minutes later stating that she got the message.

## 2018-05-05 NOTE — H&P (Signed)
Chief Complaint   Back pain  HPI   HPI: Natasha Mcintyre is a 52 y.o. female with history of chronic low back and bilateral leg pain that has been progressively worsening over the last several years. She describes pain across her lower back, with aching pain as well as numbness and tingling sensation in both her legs.  She says that the pain in her legs is significantly worsen the longer she stands or walks.  She has not noted any changes in bowel or bladder function.  She has failed conservative treatment to date and presents today for surgery. She is without any concerns today.  Patient Active Problem List   Diagnosis Date Noted  . Fibroids 10/04/2017  . Anemia 10/04/2017    PMH: Past Medical History:  Diagnosis Date  . Anemia 09/09/2017   hgb 10.8  . Arthritis   . Complication of anesthesia    SHE'S NEVER HAD GENERAL ANESTHESIA  . Fibroids   . Hypertension     PSH: Past Surgical History:  Procedure Laterality Date  . CESAREAN SECTION    . TUBAL LIGATION      No medications prior to admission.    SH: Social History   Tobacco Use  . Smoking status: Current Every Day Smoker    Packs/day: 0.50    Types: Cigarettes  . Smokeless tobacco: Never Used  Substance Use Topics  . Alcohol use: Yes    Alcohol/week: 3.0 standard drinks    Types: 3 Shots of liquor per week    Comment: occ on weekends  . Drug use: No    MEDS: Prior to Admission medications   Medication Sig Start Date End Date Taking? Authorizing Provider  hydrochlorothiazide (HYDRODIURIL) 25 MG tablet Take 1 tablet by mouth daily. 10/02/17  Yes [provider]  HYDROcodone-acetaminophen (NORCO/VICODIN) 5-325 MG tablet Take 1-2 tablets by mouth every 6 (six) hours as needed for moderate pain. 03/24/18  Yes Mcarthur Rossetti, MD  ibuprofen (ADVIL,MOTRIN) 800 MG tablet Take 800 mg by mouth every 8 (eight) hours as needed for moderate pain.  08/23/17  Yes [provider]  lisinopril  (PRINIVIL,ZESTRIL) 20 MG tablet Take 20 mg by mouth daily.  08/14/17  Yes [provider]    ALLERGY: No Known Allergies  Social History   Tobacco Use  . Smoking status: Current Every Day Smoker    Packs/day: 0.50    Types: Cigarettes  . Smokeless tobacco: Never Used  Substance Use Topics  . Alcohol use: Yes    Alcohol/week: 3.0 standard drinks    Types: 3 Shots of liquor per week    Comment: occ on weekends     Family History  Problem Relation Age of Onset  . Diabetes Mother   . Hypertension Father      ROS   ROS  Exam   There were no vitals filed for this visit. General appearance: WDWN, NAD Eyes: PERRL, Fundoscopic: normal Cardiovascular: Regular rate and rhythm without murmurs, rubs, gallops. No edema or variciosities. Distal pulses normal. Pulmonary: Clear to auscultation Musculoskeletal:     Muscle tone upper extremities: Normal    Muscle tone lower extremities: Normal    Motor exam: Upper Extremities Deltoid Bicep Tricep Grip  Right 5/5 5/5 5/5 5/5  Left 5/5 5/5 5/5 5/5   Lower Extremity IP Quad PF DF EHL  Right 5/5 5/5 5/5 5/5 5/5  Left 5/5 5/5 5/5 5/5 5/5   Neurological Awake, alert, oriented Memory and concentration grossly intact Speech  fluent, appropriate CNII: Visual fields normal CNIII/IV/VI: EOMI CNV: Facial sensation normal CNVII: Symmetric, normal strength CNVIII: Grossly normal CNIX: Normal palate movement CNXI: Trap and SCM strength normal CN XII: Tongue protrusion normal Sensation grossly intact to LT DTR: Normal Coordination (finger/nose & heel/shin): Normal  Results - Imaging/Labs   No results found for this or any previous visit (from the past 48 hour(s)).  No results found.  IMAGING: MRI of the thoracic spine was reviewed.  There is some thoracic kyphosis.  There is a minor disc bulge at T4-5, and T5-6 without any spinal cord compression.  MRI of the lumbar spine was also reviewed.  Primary finding is at L4-5  where there is significant disc degeneration with loss of height.  There is grade 1-2 anterolisthesis of L4 on L5, with severe bilateral facet arthropathy and resultant severe central and lateral recess and foraminal stenosis.  Also noted is bilateral facet arthropathy at L5-S1.   Impression/Plan   52 y.o. 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  She has had progression of her pain despite reasonable conservative treatments to this point. We will plan on proceeding with surgical decompression and fusion at L4-5.   While in the office the risks which include but are not limited to nerve root injury leading to leg or foot weakness/numbness and/or bowel and bladder dysfunction, CSF leak, bleeding, and infection.  Possible outcomes of surgery were also discussed including the possibility of persistence or worsening of pain symptoms and the possibility of accelerated adjacent level degeneration. The general risks of anesthesia were also reviewed including heart attack, stroke, and DVT/PE.   Patient stated in own language understanding and wished to proceed.

## 2018-05-06 ENCOUNTER — Inpatient Hospital Stay (HOSPITAL_COMMUNITY)
Admission: RE | Disposition: A | Discharge status: Home / Self Care | Payer: Self-pay | Source: Ambulatory Visit | Attending: Neurosurgery

## 2018-05-06 ENCOUNTER — Inpatient Hospital Stay (HOSPITAL_COMMUNITY): Payer: BLUE CROSS/BLUE SHIELD

## 2018-05-06 ENCOUNTER — Inpatient Hospital Stay (HOSPITAL_COMMUNITY)
Admission: RE | Admit: 2018-05-06 | Discharge: 2018-05-07 | DRG: 460 | Disposition: A | Discharge status: Home / Self Care | Payer: BLUE CROSS/BLUE SHIELD | Source: Ambulatory Visit | Attending: Neurosurgery | Admitting: Neurosurgery

## 2018-05-06 ENCOUNTER — Inpatient Hospital Stay (HOSPITAL_COMMUNITY): Payer: BLUE CROSS/BLUE SHIELD | Admitting: Certified Registered Nurse Anesthetist

## 2018-05-06 ENCOUNTER — Encounter (HOSPITAL_COMMUNITY): Payer: Self-pay

## 2018-05-06 DIAGNOSIS — Z79899 Other long term (current) drug therapy: Secondary | ICD-10-CM | POA: Diagnosis not present

## 2018-05-06 DIAGNOSIS — Z419 Encounter for procedure for purposes other than remedying health state, unspecified: Secondary | ICD-10-CM

## 2018-05-06 DIAGNOSIS — I10 Essential (primary) hypertension: Secondary | ICD-10-CM | POA: Diagnosis present

## 2018-05-06 DIAGNOSIS — E669 Obesity, unspecified: Secondary | ICD-10-CM | POA: Diagnosis present

## 2018-05-06 DIAGNOSIS — Z6832 Body mass index (BMI) 32.0-32.9, adult: Secondary | ICD-10-CM

## 2018-05-06 DIAGNOSIS — F17210 Nicotine dependence, cigarettes, uncomplicated: Secondary | ICD-10-CM | POA: Diagnosis present

## 2018-05-06 DIAGNOSIS — M48061 Spinal stenosis, lumbar region without neurogenic claudication: Principal | ICD-10-CM | POA: Diagnosis present

## 2018-05-06 DIAGNOSIS — M4316 Spondylolisthesis, lumbar region: Secondary | ICD-10-CM | POA: Diagnosis present

## 2018-05-06 LAB — POCT PREGNANCY, URINE: Preg Test, Ur: NEGATIVE

## 2018-05-06 SURGERY — POSTERIOR LUMBAR FUSION 1 LEVEL
Anesthesia: General | Site: Spine Lumbar

## 2018-05-06 MED ORDER — CHLORHEXIDINE GLUCONATE CLOTH 2 % EX PADS: 6.0000 | MEDICATED_PAD | Freq: Once | CUTANEOUS | Status: DC

## 2018-05-06 MED ORDER — CEFAZOLIN SODIUM-DEXTROSE 2-4 GM/100ML-% IV SOLN
2.0000 g | Freq: Three times a day (TID) | INTRAVENOUS | Status: AC
  Administered 2018-05-06 – 2018-05-07 (×2): 2 g via INTRAVENOUS
  Filled 2018-05-06 (×2): qty 100

## 2018-05-06 MED ORDER — ONDANSETRON HCL 4 MG/2ML IJ SOLN
INTRAMUSCULAR | Status: DC | PRN
  Administered 2018-05-06: 4 mg via INTRAVENOUS

## 2018-05-06 MED ORDER — EPHEDRINE 5 MG/ML INJ
INTRAVENOUS | Status: AC
  Filled 2018-05-06: qty 10

## 2018-05-06 MED ORDER — SODIUM CHLORIDE 0.9% FLUSH: 3.0000 mL | INTRAVENOUS | Status: DC | PRN

## 2018-05-06 MED ORDER — LACTATED RINGERS IV SOLN
INTRAVENOUS | Status: DC
  Administered 2018-05-06: 12:00:00 via INTRAVENOUS

## 2018-05-06 MED ORDER — ROCURONIUM BROMIDE 50 MG/5ML IV SOSY
PREFILLED_SYRINGE | INTRAVENOUS | Status: AC
  Filled 2018-05-06: qty 10

## 2018-05-06 MED ORDER — LACTATED RINGERS IV SOLN
INTRAVENOUS | Status: DC | PRN
  Administered 2018-05-06 (×3): via INTRAVENOUS

## 2018-05-06 MED ORDER — FENTANYL CITRATE (PF) 250 MCG/5ML IJ SOLN
INTRAMUSCULAR | Status: AC
  Filled 2018-05-06: qty 5

## 2018-05-06 MED ORDER — MIDAZOLAM HCL 2 MG/2ML IJ SOLN
INTRAMUSCULAR | Status: AC
  Filled 2018-05-06: qty 2

## 2018-05-06 MED ORDER — DEXAMETHASONE SODIUM PHOSPHATE 10 MG/ML IJ SOLN
INTRAMUSCULAR | Status: DC | PRN
  Administered 2018-05-06: 10 mg via INTRAVENOUS

## 2018-05-06 MED ORDER — BISACODYL 10 MG RE SUPP: 10.0000 mg | Freq: Every day | RECTAL | Status: DC | PRN

## 2018-05-06 MED ORDER — ONDANSETRON HCL 4 MG/2ML IJ SOLN: 4.0000 mg | Freq: Four times a day (QID) | INTRAMUSCULAR | Status: DC | PRN

## 2018-05-06 MED ORDER — THROMBIN 5000 UNITS EX SOLR
OROMUCOSAL | Status: DC | PRN
  Administered 2018-05-06: 5 mL via TOPICAL

## 2018-05-06 MED ORDER — BUPIVACAINE HCL (PF) 0.5 % IJ SOLN
INTRAMUSCULAR | Status: DC | PRN
  Administered 2018-05-06: 5 mL

## 2018-05-06 MED ORDER — EPHEDRINE SULFATE 50 MG/ML IJ SOLN
INTRAMUSCULAR | Status: DC | PRN
  Administered 2018-05-06: 10 mg via INTRAVENOUS

## 2018-05-06 MED ORDER — SODIUM CHLORIDE 0.9% FLUSH: 3.0000 mL | Freq: Two times a day (BID) | INTRAVENOUS | Status: DC

## 2018-05-06 MED ORDER — DOCUSATE SODIUM 100 MG PO CAPS
100.0000 mg | ORAL_CAPSULE | Freq: Two times a day (BID) | ORAL | Status: DC
  Administered 2018-05-06 – 2018-05-07 (×2): 100 mg via ORAL
  Filled 2018-05-06 (×2): qty 1

## 2018-05-06 MED ORDER — KETAMINE HCL 10 MG/ML IJ SOLN
INTRAMUSCULAR | Status: DC | PRN
  Administered 2018-05-06 (×2): 20 mg via INTRAVENOUS
  Administered 2018-05-06: 10 mg via INTRAVENOUS

## 2018-05-06 MED ORDER — BUPIVACAINE HCL (PF) 0.5 % IJ SOLN
INTRAMUSCULAR | Status: AC
  Filled 2018-05-06: qty 30

## 2018-05-06 MED ORDER — PROPOFOL 10 MG/ML IV BOLUS
INTRAVENOUS | Status: AC
  Filled 2018-05-06: qty 20

## 2018-05-06 MED ORDER — PROMETHAZINE HCL 25 MG/ML IJ SOLN: 6.2500 mg | INTRAMUSCULAR | Status: DC | PRN

## 2018-05-06 MED ORDER — PHENYLEPHRINE 40 MCG/ML (10ML) SYRINGE FOR IV PUSH (FOR BLOOD PRESSURE SUPPORT)
PREFILLED_SYRINGE | INTRAVENOUS | Status: DC | PRN
  Administered 2018-05-06 (×2): 80 ug via INTRAVENOUS

## 2018-05-06 MED ORDER — MIDAZOLAM HCL 5 MG/5ML IJ SOLN
INTRAMUSCULAR | Status: DC | PRN
  Administered 2018-05-06: .5 mg via INTRAVENOUS
  Administered 2018-05-06: 1.5 mg via INTRAVENOUS

## 2018-05-06 MED ORDER — DEXMEDETOMIDINE HCL 200 MCG/2ML IV SOLN: INTRAVENOUS | Status: DC | PRN

## 2018-05-06 MED ORDER — METHOCARBAMOL 1000 MG/10ML IJ SOLN
500.0000 mg | Freq: Four times a day (QID) | INTRAVENOUS | Status: DC | PRN
  Filled 2018-05-06 (×2): qty 5

## 2018-05-06 MED ORDER — HYDROMORPHONE HCL 1 MG/ML IJ SOLN
0.5000 mg | INTRAMUSCULAR | Status: DC | PRN
  Administered 2018-05-06: 1 mg via INTRAVENOUS
  Filled 2018-05-06: qty 1

## 2018-05-06 MED ORDER — ACETAMINOPHEN 10 MG/ML IV SOLN
INTRAVENOUS | Status: AC
  Filled 2018-05-06: qty 100

## 2018-05-06 MED ORDER — ONDANSETRON HCL 4 MG/2ML IJ SOLN
INTRAMUSCULAR | Status: AC
  Filled 2018-05-06: qty 2

## 2018-05-06 MED ORDER — OXYCODONE HCL 5 MG PO TABS
ORAL_TABLET | ORAL | Status: AC
  Filled 2018-05-06: qty 1

## 2018-05-06 MED ORDER — OXYCODONE HCL 5 MG PO TABS
5.0000 mg | ORAL_TABLET | Freq: Once | ORAL | Status: AC | PRN
  Administered 2018-05-06: 5 mg via ORAL

## 2018-05-06 MED ORDER — CEFAZOLIN SODIUM-DEXTROSE 2-4 GM/100ML-% IV SOLN
INTRAVENOUS | Status: AC
  Filled 2018-05-06: qty 100

## 2018-05-06 MED ORDER — GLYCOPYRROLATE PF 0.2 MG/ML IJ SOSY
PREFILLED_SYRINGE | INTRAMUSCULAR | Status: AC
  Filled 2018-05-06: qty 1

## 2018-05-06 MED ORDER — PHENOL 1.4 % MT LIQD: 1.0000 | OROMUCOSAL | Status: DC | PRN

## 2018-05-06 MED ORDER — OXYCODONE HCL 5 MG PO TABS
5.0000 mg | ORAL_TABLET | ORAL | Status: DC | PRN
  Administered 2018-05-06 – 2018-05-07 (×4): 10 mg via ORAL
  Filled 2018-05-06 (×4): qty 2

## 2018-05-06 MED ORDER — LIDOCAINE 2% (20 MG/ML) 5 ML SYRINGE
INTRAMUSCULAR | Status: DC | PRN
  Administered 2018-05-06: 70 mg via INTRAVENOUS

## 2018-05-06 MED ORDER — OXYCODONE HCL 5 MG/5ML PO SOLN: 5.0000 mg | Freq: Once | ORAL | Status: AC | PRN

## 2018-05-06 MED ORDER — PHENYLEPHRINE HCL 10 MG/ML IJ SOLN
INTRAMUSCULAR | Status: AC
  Filled 2018-05-06: qty 1

## 2018-05-06 MED ORDER — GABAPENTIN 300 MG PO CAPS
300.0000 mg | ORAL_CAPSULE | Freq: Three times a day (TID) | ORAL | Status: DC
  Administered 2018-05-06 – 2018-05-07 (×2): 300 mg via ORAL
  Filled 2018-05-06 (×2): qty 1

## 2018-05-06 MED ORDER — THROMBIN 20000 UNITS EX SOLR
CUTANEOUS | Status: DC | PRN
  Administered 2018-05-06: 20 mL via TOPICAL

## 2018-05-06 MED ORDER — SUGAMMADEX SODIUM 200 MG/2ML IV SOLN
INTRAVENOUS | Status: DC | PRN
  Administered 2018-05-06: 180 mg via INTRAVENOUS

## 2018-05-06 MED ORDER — HYDROCODONE-ACETAMINOPHEN 5-325 MG PO TABS: 1.0000 | ORAL_TABLET | ORAL | Status: DC | PRN

## 2018-05-06 MED ORDER — DEXMEDETOMIDINE HCL IN NACL 200 MCG/50ML IV SOLN
INTRAVENOUS | Status: DC | PRN
  Administered 2018-05-06 (×5): 4 ug via INTRAVENOUS

## 2018-05-06 MED ORDER — ACETAMINOPHEN 650 MG RE SUPP: 650.0000 mg | RECTAL | Status: DC | PRN

## 2018-05-06 MED ORDER — MENTHOL 3 MG MT LOZG: 1.0000 | LOZENGE | OROMUCOSAL | Status: DC | PRN

## 2018-05-06 MED ORDER — HYDROMORPHONE HCL 1 MG/ML IJ SOLN
INTRAMUSCULAR | Status: AC
  Filled 2018-05-06: qty 1

## 2018-05-06 MED ORDER — SODIUM CHLORIDE 0.9 % IV SOLN
INTRAVENOUS | Status: DC | PRN
  Administered 2018-05-06: 20 ug/min via INTRAVENOUS

## 2018-05-06 MED ORDER — FENTANYL CITRATE (PF) 100 MCG/2ML IJ SOLN
INTRAMUSCULAR | Status: DC | PRN
  Administered 2018-05-06 (×4): 50 ug via INTRAVENOUS
  Administered 2018-05-06: 100 ug via INTRAVENOUS

## 2018-05-06 MED ORDER — DEXAMETHASONE SODIUM PHOSPHATE 10 MG/ML IJ SOLN
INTRAMUSCULAR | Status: AC
  Filled 2018-05-06: qty 1

## 2018-05-06 MED ORDER — METHOCARBAMOL 500 MG PO TABS
500.0000 mg | ORAL_TABLET | Freq: Four times a day (QID) | ORAL | Status: DC | PRN
  Administered 2018-05-06 – 2018-05-07 (×2): 500 mg via ORAL
  Filled 2018-05-06: qty 1

## 2018-05-06 MED ORDER — FLEET ENEMA 7-19 GM/118ML RE ENEM: 1.0000 | ENEMA | Freq: Once | RECTAL | Status: DC | PRN

## 2018-05-06 MED ORDER — THROMBIN (RECOMBINANT) 20000 UNITS EX SOLR
CUTANEOUS | Status: AC
  Filled 2018-05-06: qty 20000

## 2018-05-06 MED ORDER — FENTANYL CITRATE (PF) 250 MCG/5ML IJ SOLN
INTRAMUSCULAR | Status: AC
Start: 2018-05-06 — End: 2018-05-06
  Filled 2018-05-06: qty 5

## 2018-05-06 MED ORDER — ACETAMINOPHEN 500 MG PO TABS
1000.0000 mg | ORAL_TABLET | Freq: Four times a day (QID) | ORAL | Status: DC
  Administered 2018-05-06 – 2018-05-07 (×3): 1000 mg via ORAL
  Filled 2018-05-06 (×3): qty 2

## 2018-05-06 MED ORDER — KETAMINE HCL 100 MG/ML IJ SOLN
INTRAMUSCULAR | Status: AC
  Filled 2018-05-06: qty 1

## 2018-05-06 MED ORDER — GLYCOPYRROLATE PF 0.2 MG/ML IJ SOSY
PREFILLED_SYRINGE | INTRAMUSCULAR | Status: DC | PRN
  Administered 2018-05-06: .2 mg via INTRAVENOUS

## 2018-05-06 MED ORDER — ONDANSETRON HCL 4 MG PO TABS: 4.0000 mg | ORAL_TABLET | Freq: Four times a day (QID) | ORAL | Status: DC | PRN

## 2018-05-06 MED ORDER — THROMBIN 5000 UNITS EX SOLR
CUTANEOUS | Status: AC
  Filled 2018-05-06: qty 5000

## 2018-05-06 MED ORDER — SENNA 8.6 MG PO TABS
1.0000 | ORAL_TABLET | Freq: Two times a day (BID) | ORAL | Status: DC
  Administered 2018-05-06 – 2018-05-07 (×2): 8.6 mg via ORAL
  Filled 2018-05-06 (×2): qty 1

## 2018-05-06 MED ORDER — ROCURONIUM BROMIDE 10 MG/ML (PF) SYRINGE
PREFILLED_SYRINGE | INTRAVENOUS | Status: DC | PRN
  Administered 2018-05-06: 10 mg via INTRAVENOUS
  Administered 2018-05-06: 50 mg via INTRAVENOUS
  Administered 2018-05-06: 20 mg via INTRAVENOUS

## 2018-05-06 MED ORDER — ACETAMINOPHEN 325 MG PO TABS: 650.0000 mg | ORAL_TABLET | ORAL | Status: DC | PRN

## 2018-05-06 MED ORDER — LIDOCAINE-EPINEPHRINE 1 %-1:100000 IJ SOLN
INTRAMUSCULAR | Status: DC | PRN
  Administered 2018-05-06: 5 mL

## 2018-05-06 MED ORDER — HYDROMORPHONE HCL 1 MG/ML IJ SOLN
0.2500 mg | INTRAMUSCULAR | Status: DC | PRN
  Administered 2018-05-06 (×4): 0.5 mg via INTRAVENOUS

## 2018-05-06 MED ORDER — KETAMINE HCL 50 MG/5ML IJ SOSY
PREFILLED_SYRINGE | INTRAMUSCULAR | Status: AC
  Filled 2018-05-06: qty 5

## 2018-05-06 MED ORDER — PROPOFOL 10 MG/ML IV BOLUS
INTRAVENOUS | Status: DC | PRN
  Administered 2018-05-06 (×2): 50 mg via INTRAVENOUS
  Administered 2018-05-06: 100 mg via INTRAVENOUS

## 2018-05-06 MED ORDER — CEFAZOLIN SODIUM-DEXTROSE 2-4 GM/100ML-% IV SOLN
2.0000 g | INTRAVENOUS | Status: AC
  Administered 2018-05-06: 2 g via INTRAVENOUS

## 2018-05-06 MED ORDER — ROCURONIUM BROMIDE 50 MG/5ML IV SOSY
PREFILLED_SYRINGE | INTRAVENOUS | Status: AC
  Filled 2018-05-06: qty 5

## 2018-05-06 MED ORDER — SODIUM CHLORIDE 0.9 % IV SOLN: INTRAVENOUS | Status: DC

## 2018-05-06 MED ORDER — ACETAMINOPHEN 10 MG/ML IV SOLN
INTRAVENOUS | Status: DC | PRN
  Administered 2018-05-06: 1000 mg via INTRAVENOUS

## 2018-05-06 MED ORDER — 0.9 % SODIUM CHLORIDE (POUR BTL) OPTIME
TOPICAL | Status: DC | PRN
  Administered 2018-05-06: 1000 mL

## 2018-05-06 MED ORDER — METHOCARBAMOL 500 MG PO TABS
ORAL_TABLET | ORAL | Status: AC
  Filled 2018-05-06: qty 1

## 2018-05-06 MED ORDER — SODIUM CHLORIDE 0.9 % IV SOLN
INTRAVENOUS | Status: DC | PRN
  Administered 2018-05-06: 500 mL

## 2018-05-06 MED ORDER — LIDOCAINE-EPINEPHRINE 1 %-1:100000 IJ SOLN
INTRAMUSCULAR | Status: AC
  Filled 2018-05-06: qty 1

## 2018-05-06 MED ORDER — HYDROMORPHONE HCL 1 MG/ML IJ SOLN: 0.2500 mg | INTRAMUSCULAR | Status: DC | PRN

## 2018-05-06 MED ORDER — SENNOSIDES-DOCUSATE SODIUM 8.6-50 MG PO TABS: 1.0000 | ORAL_TABLET | Freq: Every evening | ORAL | Status: DC | PRN

## 2018-05-06 MED ORDER — LIDOCAINE 2% (20 MG/ML) 5 ML SYRINGE
INTRAMUSCULAR | Status: AC
  Filled 2018-05-06: qty 5

## 2018-05-06 SURGICAL SUPPLY — 74 items
BAG DECANTER FOR FLEXI CONT (MISCELLANEOUS) ×2 IMPLANT
BASKET BONE COLLECTION (BASKET) ×2 IMPLANT
BENZOIN TINCTURE PRP APPL 2/3 (GAUZE/BANDAGES/DRESSINGS) IMPLANT
BIT DRILL 3.5 POWEREASE (BIT) ×2 IMPLANT
BLADE CLIPPER SURG (BLADE) IMPLANT
BLADE SURG 11 STRL SS (BLADE) ×2 IMPLANT
BUR MATCHSTICK NEURO 3.0 LAGG (BURR) ×2 IMPLANT
BUR PRECISION FLUTE 5.0 (BURR) ×2 IMPLANT
CANISTER SUCT 3000ML PPV (MISCELLANEOUS) ×2 IMPLANT
CARTRIDGE OIL MAESTRO DRILL (MISCELLANEOUS) ×1 IMPLANT
CONT SPEC 4OZ CLIKSEAL STRL BL (MISCELLANEOUS) ×2 IMPLANT
COVER BACK TABLE 60X90IN (DRAPES) ×2 IMPLANT
COVER WAND RF STERILE (DRAPES) ×2 IMPLANT
DECANTER SPIKE VIAL GLASS SM (MISCELLANEOUS) ×2 IMPLANT
DERMABOND ADVANCED (GAUZE/BANDAGES/DRESSINGS) ×1
DERMABOND ADVANCED .7 DNX12 (GAUZE/BANDAGES/DRESSINGS) ×1 IMPLANT
DEVICE INTERBODY ELEVATE 23X7 (Cage) ×4 IMPLANT
DIFFUSER DRILL AIR PNEUMATIC (MISCELLANEOUS) ×2 IMPLANT
DRAPE C-ARM 42X72 X-RAY (DRAPES) ×2 IMPLANT
DRAPE C-ARMOR (DRAPES) ×2 IMPLANT
DRAPE LAPAROTOMY 100X72X124 (DRAPES) ×2 IMPLANT
DRAPE SURG 17X23 STRL (DRAPES) ×2 IMPLANT
DRSG OPSITE POSTOP 4X8 (GAUZE/BANDAGES/DRESSINGS) ×2 IMPLANT
DURAPREP 26ML APPLICATOR (WOUND CARE) ×2 IMPLANT
ELECT REM PT RETURN 9FT ADLT (ELECTROSURGICAL) ×2
ELECTRODE REM PT RTRN 9FT ADLT (ELECTROSURGICAL) ×1 IMPLANT
GAUZE 4X4 16PLY RFD (DISPOSABLE) IMPLANT
GAUZE SPONGE 4X4 12PLY STRL (GAUZE/BANDAGES/DRESSINGS) IMPLANT
GLOVE BIO SURGEON STRL SZ7.5 (GLOVE) ×2 IMPLANT
GLOVE BIOGEL PI IND STRL 7.0 (GLOVE) ×2 IMPLANT
GLOVE BIOGEL PI IND STRL 7.5 (GLOVE) ×2 IMPLANT
GLOVE BIOGEL PI INDICATOR 7.0 (GLOVE) ×2
GLOVE BIOGEL PI INDICATOR 7.5 (GLOVE) ×2
GLOVE ECLIPSE 7.0 STRL STRAW (GLOVE) ×4 IMPLANT
GLOVE EXAM NITRILE LRG STRL (GLOVE) IMPLANT
GLOVE EXAM NITRILE XL STR (GLOVE) IMPLANT
GLOVE EXAM NITRILE XS STR PU (GLOVE) IMPLANT
GLOVE SURG SS PI 7.5 STRL IVOR (GLOVE) ×8 IMPLANT
GOWN STRL REUS W/ TWL LRG LVL3 (GOWN DISPOSABLE) ×5 IMPLANT
GOWN STRL REUS W/ TWL XL LVL3 (GOWN DISPOSABLE) IMPLANT
GOWN STRL REUS W/TWL 2XL LVL3 (GOWN DISPOSABLE) IMPLANT
GOWN STRL REUS W/TWL LRG LVL3 (GOWN DISPOSABLE) ×5
GOWN STRL REUS W/TWL XL LVL3 (GOWN DISPOSABLE)
HEMOSTAT POWDER KIT SURGIFOAM (HEMOSTASIS) ×2 IMPLANT
KIT BASIN OR (CUSTOM PROCEDURE TRAY) ×2 IMPLANT
KIT INFUSE XX SMALL 0.7CC (Orthopedic Implant) ×2 IMPLANT
KIT POSITION SURG JACKSON T1 (MISCELLANEOUS) ×2 IMPLANT
KIT TURNOVER KIT B (KITS) ×2 IMPLANT
MILL MEDIUM DISP (BLADE) ×2 IMPLANT
NEEDLE HYPO 18GX1.5 BLUNT FILL (NEEDLE) IMPLANT
NEEDLE HYPO 22GX1.5 SAFETY (NEEDLE) ×2 IMPLANT
NEEDLE SPNL 18GX3.5 QUINCKE PK (NEEDLE) ×2 IMPLANT
NS IRRIG 1000ML POUR BTL (IV SOLUTION) ×2 IMPLANT
OIL CARTRIDGE MAESTRO DRILL (MISCELLANEOUS) ×2
PACK LAMINECTOMY NEURO (CUSTOM PROCEDURE TRAY) ×2 IMPLANT
PAD ARMBOARD 7.5X6 YLW CONV (MISCELLANEOUS) ×6 IMPLANT
PASTE BONE GRAFTON 5CC (Bone Implant) ×2 IMPLANT
ROD CC 30MM (Rod) ×4 IMPLANT
SCREW 5.5X30MM (Screw) ×4 IMPLANT
SCREW 5.5X35MM (Screw) ×2 IMPLANT
SCREW BN 35X5.5XMA NS SPNE (Screw) ×2 IMPLANT
SCREW SET SOLERA (Screw) ×4 IMPLANT
SCREW SET SOLERA TI (Screw) ×4 IMPLANT
SPONGE LAP 4X18 RFD (DISPOSABLE) IMPLANT
SPONGE SURGIFOAM ABS GEL 100 (HEMOSTASIS) ×2 IMPLANT
STRIP CLOSURE SKIN 1/2X4 (GAUZE/BANDAGES/DRESSINGS) IMPLANT
SUT VIC AB 0 CT1 18XCR BRD8 (SUTURE) ×1 IMPLANT
SUT VIC AB 0 CT1 8-18 (SUTURE) ×1
SUT VICRYL 3-0 RB1 18 ABS (SUTURE) ×2 IMPLANT
SYR 3ML LL SCALE MARK (SYRINGE) ×4 IMPLANT
TOWEL GREEN STERILE (TOWEL DISPOSABLE) ×2 IMPLANT
TOWEL GREEN STERILE FF (TOWEL DISPOSABLE) ×2 IMPLANT
TRAY FOLEY MTR SLVR 16FR STAT (SET/KITS/TRAYS/PACK) ×2 IMPLANT
WATER STERILE IRR 1000ML POUR (IV SOLUTION) ×2 IMPLANT

## 2018-05-06 NOTE — Anesthesia Procedure Notes (Signed)
Procedure Name: Intubation Date/Time: 05/06/2018 1:01 PM Performed by: Lance Coon, CRNA Pre-anesthesia Checklist: Patient identified, Emergency Drugs available, Suction available, Patient being monitored and Timeout performed Patient Re-evaluated:Patient Re-evaluated prior to induction Oxygen Delivery Method: Circle system utilized Preoxygenation: Pre-oxygenation with 100% oxygen Induction Type: IV induction Ventilation: Mask ventilation without difficulty Laryngoscope Size: Mac and 3 Grade View: Grade I Tube type: Oral Tube size: 7.0 mm Number of attempts: 1 Airway Equipment and Method: Stylet Placement Confirmation: ETT inserted through vocal cords under direct vision,  positive ETCO2 and breath sounds checked- equal and bilateral Secured at: 21 cm Tube secured with: Tape Dental Injury: Teeth and Oropharynx as per pre-operative assessment

## 2018-05-06 NOTE — Op Note (Signed)
NEUROSURGERY OPERATIVE NOTE   PREOP DIAGNOSIS:  1. Lumbar stenosis, L4-5 2. Spondylolisthesis, L4-5  POSTOP DIAGNOSIS: Same  PROCEDURE: 1. L4-5 laminectomy with facetectomy for decompression of exiting nerve roots, more than would be required for placement of interbody graft 2. Placement of anterior interbody device - Medtronic expandable 64mm cage x2 3. Posterior non-segmental instrumentation using cortical pedicle screws at L4 - L5 - Medtronic solera 5.5 x 35 x2 @ L4, 5.5 x 30 x2 @ L5 4. Interbody arthrodesis, L4-5 5. Use of locally harvested bone autograft 6. Use of non-structural bone allograft - BMP-2, [place]rafton  SURGEON: Dr. Consuella Lose, MD  ASSISTANT: Ferne Reus, PA-C  ANESTHESIA: General Endotracheal  EBL: 300cc  SPECIMENS: None  DRAINS: None  COMPLICATIONS: None immediate  CONDITION: Hemodynamically stable to PACU  HISTORY: Natasha Mcintyre is a 52 y.o. female who has been followed in the outpatient clinic with back and leg pain related to stenosis with spondylolisthesis at L4-5. She attempted multiple conservative treatments and ultimately elected to proceed with surgical decompression and fusion. Risks and benefits were reviewed and consent was obtained.  PROCEDURE IN DETAIL: After informed consent was obtained and witnessed, the patient was brought to the operating room. After induction of general anesthesia, the patient was positioned on the operative table in the prone position. All pressure points were meticulously padded. Incision was then marked out and prepped and draped in the usual sterile fashion.  After timeout was conducted, skin was infiltrated with local anesthetic. Skin incision was then made sharply and Bovie electrocautery was used to dissect the subcutaneous tissue until the lumbodorsal fascia was identified and incised. The muscle was then elevated in the subperiosteal plane and the L4 and L5 lamina and the intervening L4-5 facet  complexes were identified. Self-retaining retractors were then placed.  Intraoperative fluoroscopy was used to confirm our location at the L4-L5 level, identified by the spondylolisthesis.  At this point attention was turned to decompression. Complete L4 laminectomy with facetectomy was completed using a combination of Kerrison rongeurs and a high-speed drill.  The facet complex was noted to be significantly hypertrophic bilaterally, with a significant amount of lateral recess stenosis due to the facet arthropathy.  After removal of the facet complex including the entire superior articulating process of L5, good decompression of the exiting and traversing roots was confirmed.   Disc space was then identified and incised.  The spondylolisthesis was clearly evident, and the disc space was noted to be significantly collapsed.  Initially, the disc space was entered with a osteotome.  A small shaver was then initially used in order to begin the discectomy.  Disc space spreader was then placed, and the contralateral disc space was incised and discectomy was completed.  In this fashion, I was able to complete the discectomy at L4-5.  Endplates were prepared with toothed curettes, and bone harvested during decompression was mixed with grafton and BMP and packed into the interspace. 34mm expandable cages were then tapped into place, position confirmed with fluoroscopy.  They were expanded to approximately 10 to 11 mm.   At this point, the pilot holes for bilateral L4 and L5 cortical pedicle screws were created, and the pilot holes were then drilled and tapped to 5.5 mm. Screws were then placed in L4 and L5. Rod was then placed, set screws placed and final tightened. Final AP and lateral fluoroscopic images confirmed good position.  The wound was then irrigated with copious amounts of antibiotic saline, then closed in standard fashion using  a combination of interrupted 0 and 3-0 Vicryl stitches in the muscular,  fascial, and subcutaneous layers. Skin was then closed using standard Dermabond. Sterile dressing was then applied. The patient was then transferred to the stretcher, extubated, and taken to the postanesthesia care unit in stable hemodynamic condition.  At the end of the case all sponge, needle, cottonoid, and instrument counts were correct.

## 2018-05-06 NOTE — Transfer of Care (Signed)
Immediate Anesthesia Transfer of Care Note  Patient: Natasha Mcintyre  Procedure(s) Performed: POSTERIOR LUMBAR FUSION LUMBAR FOUR- LUMBAR FIVE, PLACEMENT INTERBODY BIOMECHANICAL DEVICE, INTERBODY ARTHRODESIS, POSTERIOR NON-SEGMENTAL INSTRUMENTATION (N/A Spine Lumbar)  Patient Location: PACU  Anesthesia Type:General  Level of Consciousness: awake and patient cooperative  Airway & Oxygen Therapy: Patient Spontanous Breathing and Patient connected to face mask oxygen  Post-op Assessment: Report given to RN and Post -op Vital signs reviewed and stable  Post vital signs: Reviewed and stable  Last Vitals:  Vitals Value Taken Time  BP 94/55 05/06/2018  4:32 PM  Temp    Pulse 78 05/06/2018  4:33 PM  Resp 18 05/06/2018  4:33 PM  SpO2 99 % 05/06/2018  4:33 PM  Vitals shown include unvalidated device data.  Last Pain:  Vitals:   05/06/18 1130  TempSrc:   PainSc: 10-Worst pain ever         Complications: No apparent anesthesia complications

## 2018-05-06 NOTE — Anesthesia Preprocedure Evaluation (Addendum)
Anesthesia Evaluation  Patient identified by MRN, date of birth, ID band Patient awake    Reviewed: Allergy & Precautions, NPO status , Patient's Chart, lab work & pertinent test results  Airway Mallampati: III  TM Distance: >3 FB Neck ROM: Full    Dental  (+) Edentulous Lower, Edentulous Upper   Pulmonary Current Smoker,    Pulmonary exam normal breath sounds clear to auscultation       Cardiovascular hypertension, Pt. on medications Normal cardiovascular exam Rhythm:Regular Rate:Normal  ECG: NSR, rate 64   Neuro/Psych negative neurological ROS  negative psych ROS   GI/Hepatic negative GI ROS, Neg liver ROS,   Endo/Other  negative endocrine ROS  Renal/GU negative Renal ROS     Musculoskeletal negative musculoskeletal ROS (+)   Abdominal (+) + obese,   Peds  Hematology negative hematology ROS (+)   Anesthesia Other Findings SPONDYLOLISTHESIS AT LUMBAR 4- LUMBAR 5 LEVEL  Reproductive/Obstetrics hcg negative                            Anesthesia Physical Anesthesia Plan  ASA: II  Anesthesia Plan: General   Post-op Pain Management:    Induction: Intravenous  PONV Risk Score and Plan: 2 and Ondansetron, Dexamethasone, Treatment may vary due to age or medical condition and Midazolam  Airway Management Planned: Oral ETT  Additional Equipment:   Intra-op Plan:   Post-operative Plan: Extubation in OR  Informed Consent: I have reviewed the patients History and Physical, chart, labs and discussed the procedure including the risks, benefits and alternatives for the proposed anesthesia with the patient or authorized representative who has indicated his/her understanding and acceptance.   Dental advisory given  Plan Discussed with: CRNA  Anesthesia Plan Comments:         Anesthesia Quick Evaluation

## 2018-05-06 NOTE — Progress Notes (Signed)
Orthopedic Tech Progress Note Patient Details:  Tahirah Sara Mar 17, 1966 [id number]  Patient ID: Natasha Mcintyre, female   DOB: November 01, 1965, 52 y.o.   MRN: [id number]   [name] 05/06/2018, 5:20 PM Called in bio-tech brace order; spoke with answering service

## 2018-05-07 LAB — CBC
HCT: 33.9 % — ABNORMAL LOW (ref 36.0–46.0)
Hemoglobin: 10.3 g/dL — ABNORMAL LOW (ref 12.0–15.0)
MCH: 27.9 pg (ref 26.0–34.0)
MCHC: 30.4 g/dL (ref 30.0–36.0)
MCV: 91.9 fL (ref 80.0–100.0)
Platelets: 222 10*3/uL (ref 150–400)
RBC: 3.69 MIL/uL — ABNORMAL LOW (ref 3.87–5.11)
RDW: 13.9 % (ref 11.5–15.5)
WBC: 12.2 10*3/uL — ABNORMAL HIGH (ref 4.0–10.5)
nRBC: 0 % (ref 0.0–0.2)

## 2018-05-07 LAB — BASIC METABOLIC PANEL
Anion gap: 11 (ref 5–15)
BUN: 13 mg/dL (ref 6–20)
CO2: 19 mmol/L — ABNORMAL LOW (ref 22–32)
Calcium: 8.8 mg/dL — ABNORMAL LOW (ref 8.9–10.3)
Chloride: 104 mmol/L (ref 98–111)
Creatinine, Ser: 0.75 mg/dL (ref 0.44–1.00)
GFR calc Af Amer: >60 mL/min (ref >60)
GFR calc non Af Amer: >60 mL/min (ref >60)
Glucose, Bld: 136 mg/dL — ABNORMAL HIGH (ref 70–99)
Potassium: 4 mmol/L (ref 3.5–5.1)
Sodium: 134 mmol/L — ABNORMAL LOW (ref 135–145)

## 2018-05-07 LAB — PROTIME-INR
INR: 1.11
Prothrombin Time: 14.2 seconds (ref 11.4–15.2)

## 2018-05-07 LAB — APTT: aPTT: 28 seconds (ref 24–36)

## 2018-05-07 MED ORDER — METHOCARBAMOL 750 MG PO TABS: 750.0000 mg | ORAL_TABLET | Freq: Three times a day (TID) | ORAL | 1 refills | Status: DC | PRN

## 2018-05-07 MED ORDER — OXYCODONE-ACETAMINOPHEN 7.5-325 MG PO TABS: 1.0000 | ORAL_TABLET | ORAL | 0 refills | Status: DC | PRN

## 2018-05-07 MED FILL — Thrombin (Recombinant) For Soln 20000 Unit: CUTANEOUS | Qty: 1 | Status: AC

## 2018-05-07 NOTE — Progress Notes (Signed)
  NEUROSURGERY PROGRESS NOTE   No issues overnight.  Pre op pain resolved Complains of appropriate back soreness Tolerating po Voiding normal Eager for d/c  EXAM:  BP 134/70 (BP Location: Right Arm)   Pulse 64   Temp 98.3 F (36.8 C) (Oral)   Resp 18   Ht 5\' 4"  (1.626 m)   Wt 86 kg   SpO2 95%   BMI 32.54 kg/m   Awake, alert, oriented  Speech fluent, appropriate  CN grossly intact  5/5 BUE/BLE  Wound c.d.i  PLAN Doing well this am. Pre op pain resolved Will d/c home

## 2018-05-07 NOTE — Evaluation (Signed)
Occupational Therapy Evaluation Patient Details Name: Natasha Mcintyre MRN: [id number] DOB: 20-Feb-1966 Today's Date: 05/07/2018    History of Present Illness Pt is a 52 y/o female with spondylolisthesis s/p L4-5 PLIF. PMH: anemia, arthritis, HTN.    Clinical Impression   PTA patient independent with ADLs, limited IADLs.  Currently admitted for above and limited by pain, decreased activity tolerance, and precaution adherence. She demonstrates ability to complete UB ADLs with setup, LB bathing with min assist, LB dressing with mod assist, toilet transfers with supervision using RW, and simulated tub transfers with min guard assist.  Patient educated on precautions, brace management and wear schedule, safety, ADL compensatory techniques, safety, mobility, and recommendations.  Patient will have support of fiancee 24/7, who plans to assist with ADLs as needed.  At this time, no further OT needs identified and OT signing off.  Thank you for this referral.     Follow Up Recommendations  No OT follow up;Supervision - Intermittent(supervision for ADLs and mobility )    Equipment Recommendations  3 in 1 bedside commode    Recommendations for Other Services       Precautions / Restrictions Precautions Precautions: Back Precaution Booklet Issued: Yes (comment) Precaution Comments: Reviewed handout with pt and family. Pt was cued for precautions during functional mobility.  Required Braces or Orthoses: Spinal Brace Spinal Brace: Lumbar corset;Applied in sitting position(alreadly applied upon therapist entering room) Restrictions Weight Bearing Restrictions: No      Mobility Bed Mobility Overal bed mobility: Modified Independent;Needs Assistance Bed Mobility: Rolling;Sit to Sidelying Rolling: Supervision       Sit to sidelying: Supervision General bed mobility comments: verbal cueing to retrun to supine using log rolling technique   Transfers Overall transfer level: Needs  assistance Equipment used: Rolling walker (2 wheeled) Transfers: Sit to/from Stand Sit to Stand: Supervision         General transfer comment: Supervision for safety. Pt required increased time to power-up to full stand due to pain. VC's for improved posture and maintaining precautions throughout transfers.    Balance Overall balance assessment: Mild deficits observed, not formally tested                                         ADL either performed or assessed with clinical judgement   ADL Overall ADL's : Needs assistance/impaired     Grooming: Supervision/safety;Standing   Upper Body Bathing: Set up;Sitting   Lower Body Bathing: Moderate assistance;Sitting/lateral leans;Cueing for compensatory techniques Lower Body Bathing Details (indicate cue type and reason): reviewed compensatory techniques, precautions and safety; unable to complete figure 4 at this time, agreealbe to bathe seated and will have assist  Upper Body Dressing : Set up;Sitting   Lower Body Dressing: Moderate assistance;Sit to/from stand;Cueing for back precautions;Cueing for compensatory techniques Lower Body Dressing Details (indicate cue type and reason): reviewed compensatory techniques, back precautions, and safety; return demonstrated use of AE but reports fiancee will assist at [employer] Transfer: Supervision/safety;Ambulation;RW Toilet Transfer Details (indicate cue type and reason): simulated in room, cueing for hand placement and safety Toileting- Clothing Manipulation and Hygiene: Sit to/from stand;Supervision/safety;Cueing for compensatory techniques   Tub/ Shower Transfer: Tub transfer;Min guard;Ambulation;3 in 1;Rolling walker Tub/Shower Transfer Details (indicate cue type and reason): simulated in room, reviewed safe technique with 3:1 and RW; min guard for safety and will have support of fianceeat dc  Functional mobility during ADLs: Supervision/safety;Rolling  walker General ADL  Comments: Pt educated on compensatory techniques, safety and precautions; will have support of signficant other at dc.     Vision   Vision Assessment?: No apparent visual deficits     Perception     Praxis      Pertinent Vitals/Pain Pain Assessment: Faces Faces Pain Scale: Hurts even more Pain Location: Incision site Pain Descriptors / Indicators: Operative site guarding;Discomfort Pain Intervention(s): Monitored during session;Repositioned     Hand Dominance Right   Extremity/Trunk Assessment Upper Extremity Assessment Upper Extremity Assessment: Overall WFL for tasks assessed   Lower Extremity Assessment Lower Extremity Assessment: Defer to PT evaluation   Cervical / Trunk Assessment Cervical / Trunk Assessment: Other exceptions Cervical / Trunk Exceptions: s/p surgery   Communication Communication Communication: No difficulties   Cognition Arousal/Alertness: Awake/alert Behavior During Therapy: WFL for tasks assessed/performed Overall Cognitive Status: Within Functional Limits for tasks assessed                                     General Comments  fiancee present and supportive    Exercises     Shoulder Instructions      Home Living Family/patient expects to be discharged to:: Private residence Living Arrangements: Spouse/significant other Available Help at Discharge: Family;Available 24 hours/day(fiancee 24/7) Type of Home: House Home Access: Level entry     Home Layout: One level     Bathroom Shower/Tub: Teacher, early years/pre: Standard     Home Equipment: None          Prior Functioning/Environment Level of Independence: Independent        Comments: works at [employer], independent ADLs limited IADLs        OT Problem List: Decreased activity tolerance;Impaired balance (sitting and/or standing);Decreased safety awareness;Decreased knowledge of use of DME or AE;Decreased knowledge of precautions;Pain      OT  Treatment/Interventions:      OT Goals(Current goals can be found in the care plan section) Acute Rehab OT Goals Patient Stated Goal: "I want to be home by lunch today" OT Goal Formulation: With patient  OT Frequency:     Barriers to D/C:            Co-evaluation              AM-PAC PT "6 Clicks" Daily Activity     Outcome Measure Help from another person eating meals?: None Help from another person taking care of personal grooming?: None Help from another person toileting, which includes using toliet, bedpan, or urinal?: None Help from another person bathing (including washing, rinsing, drying)?: A Little Help from another person to put on and taking off regular upper body clothing?: None Help from another person to put on and taking off regular lower body clothing?: A Lot 6 Click Score: 21   End of Session Equipment Utilized During Treatment: Gait belt;Rolling walker;Back brace Nurse Communication: Mobility status  Activity Tolerance: Patient tolerated treatment well Patient left: in bed;with call bell/phone within reach;with family/visitor present  OT Visit Diagnosis: Unsteadiness on feet (R26.81);Pain Pain - part of body: (back )                Time: 1245-8099 OT Time Calculation (min): 18 min Charges:  OT General Charges $OT Visit: 1 Visit OT Evaluation $OT Eval Low Complexity: [place], OT Acute Rehabilitation Services Pager [phone] Office [phone]  Delight Stare 05/07/2018, 11:29 AM

## 2018-05-07 NOTE — Discharge Summary (Signed)
Physician Discharge Summary  Patient ID: Natasha Mcintyre MRN: [id number] DOB/AGE: 1966/04/13 52 y.o.  Admit date: 05/06/2018 Discharge date: 05/07/2018  Admission Diagnoses:  Lumbar spinal stenosis  Discharge Diagnoses:  Same Active Problems:   Lumbar spinal stenosis   Discharged Condition: Stable  Hospital Course:  Natasha Mcintyre is a 52 y.o. female who was admitted for the below procedure. There were no post operative complications. At time of discharge, pain was well controlled, ambulating with Pt/OT, tolerating po, voiding normal. Ready for discharge.  Treatments: Surgery - L4-5 PLIF  Discharge Exam: Blood pressure 134/70, pulse 64, temperature 98.3 F (36.8 C), temperature source Oral, resp. rate 18, height 5\' 4"  (1.626 m), weight 86 kg, SpO2 95 %. Awake, alert, oriented Speech fluent, appropriate CN grossly intact 5/5 BUE/BLE Wound c/d/i  Disposition: Discharge disposition: 01-Home or Self Care     Home  Discharge Instructions    Call MD for:  difficulty breathing, headache or visual disturbances   Complete by:  As directed    Call MD for:  persistant dizziness or light-headedness   Complete by:  As directed    Call MD for:  redness, tenderness, or signs of infection (pain, swelling, redness, odor or green/yellow discharge around incision site)   Complete by:  As directed    Call MD for:  severe uncontrolled pain   Complete by:  As directed    Call MD for:  temperature >100.4   Complete by:  As directed    Diet general   Complete by:  As directed    Driving Restrictions   Complete by:  As directed    Do not drive until given clearance.   Increase activity slowly   Complete by:  As directed    Lifting restrictions   Complete by:  As directed    Do not lift anything >10lbs. Avoid bending and twisting in awkward positions. Avoid bending at the back.   May shower / Bathe   Complete by:  As directed    In 24 hours. Okay to wash wound with warm  soapy water. Avoid scrubbing the wound. Pat dry.   Remove dressing in 24 hours   Complete by:  As directed      Allergies as of 05/07/2018   No Known Allergies     Medication List    STOP taking these medications   HYDROcodone-acetaminophen 5-325 MG tablet Commonly known as:  NORCO/VICODIN     TAKE these medications   hydrochlorothiazide 25 MG tablet Commonly known as:  HYDRODIURIL Take 1 tablet by mouth daily.   ibuprofen 800 MG tablet Commonly known as:  ADVIL,MOTRIN Take 800 mg by mouth every 8 (eight) hours as needed for moderate pain.   lisinopril 20 MG tablet Commonly known as:  PRINIVIL,ZESTRIL Take 20 mg by mouth daily.   methocarbamol 750 MG tablet Commonly known as:  ROBAXIN Take 1 tablet (750 mg total) by mouth 3 (three) times daily as needed for muscle spasms.   oxyCODONE-acetaminophen 7.5-325 MG tablet Commonly known as:  PERCOCET Take 1 tablet by mouth every 4 (four) hours as needed.        SignedAlyson Ingles 05/07/2018, 7:57 AM

## 2018-05-07 NOTE — Discharge Instructions (Signed)
Can remove transparent dressing and shower 48 hours after surgery Walk as much as possible No heavy lifting >10 lbs No excessive bending/twisting at the waist  

## 2018-05-07 NOTE — Evaluation (Signed)
Physical Therapy Evaluation and Discharge Patient Details Name: Natasha Mcintyre MRN: [id number] DOB: 03/19/1966 Today's Date: 05/07/2018   History of Present Illness  Pt is a 52 y/o female with spondylolisthesis s/p L4-5 PLIF. PMH: anemia, arthritis, HTN.   Clinical Impression  Patient evaluated by Physical Therapy with no further acute PT needs identified. All education has been completed and the patient has no further questions. At the time of PT eval pt was able to perform bed mobility and ambulation with mod I, and sit<>stand transfers with supervision for safety. Overall pt guarded with pain but did not require any assistance from therapist throughout. Pt was educated on car transfer, activity progression, brace application/wearing schedule, and general safety with DME. See below for any follow-up Physical Therapy or equipment needs. PT is signing off. Thank you for this referral.   Follow Up Recommendations No PT follow up;Supervision - Intermittent    Equipment Recommendations  Rolling walker with 5" wheels;3in1 (PT)    Recommendations for Other Services       Precautions / Restrictions Precautions Precautions: Back Precaution Booklet Issued: Yes (comment) Precaution Comments: Reviewed handout with pt and family. Pt was cued for precautions during functional mobility.  Required Braces or Orthoses: Spinal Brace Spinal Brace: Lumbar corset;Applied in sitting position Restrictions Weight Bearing Restrictions: No      Mobility  Bed Mobility Overal bed mobility: Modified Independent             General bed mobility comments: VC's for optimal technique but overall performed well and no assist was required.   Transfers Overall transfer level: Needs assistance Equipment used: Rolling walker (2 wheeled) Transfers: Sit to/from Stand Sit to Stand: Supervision         General transfer comment: Supervision for safety. Pt required increased time to power-up to full stand  due to pain. VC's for improved posture and maintaining precautions throughout transfers.  Ambulation/Gait Ambulation/Gait assistance: Modified independent (Device/Increase time) Gait Distance (Feet): 300 Feet Assistive device: Rolling walker (2 wheeled) Gait Pattern/deviations: Step-through pattern;Decreased stride length;Trunk flexed Gait velocity: Decreased Gait velocity interpretation: 1.31 - 2.62 ft/sec, indicative of limited community ambulator General Gait Details: VC's for improved posture and closer walker proximity. Pt was able to make corrective changes but unable to maintain. Several short standing rest breaks required due to pain and fatigue. Overall, no LOB or unsteadiness with the RW and pt did not require assistance from therapist.   Stairs            Wheelchair Mobility    Modified Rankin (Stroke Patients Only)       Balance Overall balance assessment: Mild deficits observed, not formally tested                                           Pertinent Vitals/Pain Pain Assessment: Faces Faces Pain Scale: Hurts even more Pain Location: Incision site Pain Descriptors / Indicators: Burning;Operative site guarding Pain Intervention(s): Limited activity within patient's tolerance;Monitored during session;Repositioned    Home Living Family/patient expects to be discharged to:: Private residence Living Arrangements: Spouse/significant other Available Help at Discharge: Family;Available 24 hours/day Type of Home: House Home Access: Level entry     Home Layout: One level Home Equipment: None      Prior Function Level of Independence: Independent               Hand Dominance  Extremity/Trunk Assessment   Upper Extremity Assessment Upper Extremity Assessment: Defer to OT evaluation    Lower Extremity Assessment Lower Extremity Assessment: Generalized weakness(Consistent with pre-op diagnosis)    Cervical / Trunk  Assessment Cervical / Trunk Assessment: Other exceptions Cervical / Trunk Exceptions: s/p surgery  Communication   Communication: No difficulties  Cognition Arousal/Alertness: Awake/alert Behavior During Therapy: WFL for tasks assessed/performed Overall Cognitive Status: Within Functional Limits for tasks assessed                                        General Comments      Exercises     Assessment/Plan    PT Assessment Patent does not need any further PT services  PT Problem List         PT Treatment Interventions      PT Goals (Current goals can be found in the Care Plan section)  Acute Rehab PT Goals Patient Stated Goal: "I want to be home by lunch today" PT Goal Formulation: All assessment and education complete, DC therapy    Frequency     Barriers to discharge        Co-evaluation               AM-PAC PT "6 Clicks" Daily Activity  Outcome Measure Difficulty turning over in bed (including adjusting bedclothes, sheets and blankets)?: None Difficulty moving from lying on back to sitting on the side of the bed? : A Little Difficulty sitting down on and standing up from a chair with arms (e.g., wheelchair, bedside commode, etc,.)?: A Little Help needed moving to and from a bed to chair (including a wheelchair)?: A Little Help needed walking in hospital room?: A Little Help needed climbing 3-5 steps with a railing? : A Little 6 Click Score: 19    End of Session Equipment Utilized During Treatment: Gait belt;Back brace Activity Tolerance: Patient tolerated treatment well Patient left: with call bell/phone within reach;with family/visitor present;Other (comment)(Sitting EOB awaiting OT) Nurse Communication: Mobility status PT Visit Diagnosis: Pain;Difficulty in walking, not elsewhere classified (R26.2) Pain - part of body: (incision site)    Time: 9201-0071 PT Time Calculation (min) (ACUTE ONLY): 22 min   Charges:   PT Evaluation $PT  Eval Low Complexity: [place], PT, DPT Acute Rehabilitation Services Pager: [phone] Office: [phone]   Thelma Comp 05/07/2018, 11:05 AM

## 2018-05-07 NOTE — Progress Notes (Signed)
Pt doing well. Pt and family given D/C instructions with Rx's, verbal understanding was provided. Pt's incision is clean and dry with no sign of infection. Pt's IV was removed prior to D/C. Pt received RW and 3-n-1 from [facility] per MD order. Pt D/C'd home via wheelchair per MD order. Pt is stable @ D/C and has no other needs at this time. Holli Humbles, RN

## 2018-05-08 NOTE — Anesthesia Postprocedure Evaluation (Signed)
Anesthesia Post Note  Patient: Pharmacologist  Procedure(s) Performed: POSTERIOR LUMBAR FUSION LUMBAR FOUR- LUMBAR FIVE, PLACEMENT INTERBODY BIOMECHANICAL DEVICE, INTERBODY ARTHRODESIS, POSTERIOR NON-SEGMENTAL INSTRUMENTATION (N/A Spine Lumbar)     Patient location during evaluation: PACU Anesthesia Type: General Level of consciousness: awake and alert Pain management: pain level controlled Vital Signs Assessment: post-procedure vital signs reviewed and stable Respiratory status: spontaneous breathing, nonlabored ventilation and respiratory function stable Cardiovascular status: blood pressure returned to baseline and stable Postop Assessment: no apparent nausea or vomiting Anesthetic complications: no    Last Vitals:  Vitals:   05/07/18 0355 05/07/18 0722  BP: 128/74 134/70  Pulse: 64 64  Resp: 18 18  Temp: 36.9 C 36.8 C  SpO2: 97% 95%    Last Pain:  Vitals:   05/07/18 1010  TempSrc:   PainSc: [place]

## 2018-06-14 ENCOUNTER — Encounter

## 2018-09-24 ENCOUNTER — Ambulatory Visit: Payer: BLUE CROSS/BLUE SHIELD | Admitting: Obstetrics and Gynecology

## 2020-06-22 ENCOUNTER — Encounter (HOSPITAL_BASED_OUTPATIENT_CLINIC_OR_DEPARTMENT_OTHER): Payer: Self-pay

## 2020-06-22 ENCOUNTER — Emergency Department (HOSPITAL_BASED_OUTPATIENT_CLINIC_OR_DEPARTMENT_OTHER)
Admission: EM | Admit: 2020-06-22 | Discharge: 2020-06-22 | Disposition: A | Discharge status: Home / Self Care | Payer: 59 | Attending: Emergency Medicine | Admitting: Emergency Medicine

## 2020-06-22 ENCOUNTER — Emergency Department (HOSPITAL_BASED_OUTPATIENT_CLINIC_OR_DEPARTMENT_OTHER): Payer: 59

## 2020-06-22 ENCOUNTER — Other Ambulatory Visit: Payer: Self-pay

## 2020-06-22 DIAGNOSIS — F17210 Nicotine dependence, cigarettes, uncomplicated: Secondary | ICD-10-CM | POA: Insufficient documentation

## 2020-06-22 DIAGNOSIS — Z79899 Other long term (current) drug therapy: Secondary | ICD-10-CM | POA: Diagnosis not present

## 2020-06-22 DIAGNOSIS — M545 Low back pain, unspecified: Secondary | ICD-10-CM | POA: Insufficient documentation

## 2020-06-22 DIAGNOSIS — M542 Cervicalgia: Secondary | ICD-10-CM | POA: Insufficient documentation

## 2020-06-22 DIAGNOSIS — I10 Essential (primary) hypertension: Secondary | ICD-10-CM | POA: Insufficient documentation

## 2020-06-22 MED ORDER — CYCLOBENZAPRINE HCL 10 MG PO TABS: 10.0000 mg | ORAL_TABLET | Freq: Two times a day (BID) | ORAL | 0 refills | Status: DC | PRN

## 2020-06-22 MED ORDER — ACETAMINOPHEN 325 MG PO TABS
650.0000 mg | ORAL_TABLET | Freq: Once | ORAL | Status: AC
  Administered 2020-06-22: 650 mg via ORAL
  Filled 2020-06-22: qty 2

## 2020-06-22 MED ORDER — NAPROXEN 375 MG PO TABS: 375.0000 mg | ORAL_TABLET | Freq: Two times a day (BID) | ORAL | 0 refills | Status: DC

## 2020-06-22 MED ORDER — OXYCODONE-ACETAMINOPHEN 5-325 MG PO TABS
1.0000 | ORAL_TABLET | Freq: Once | ORAL | Status: AC
  Administered 2020-06-22: 1 via ORAL
  Filled 2020-06-22: qty 1

## 2020-06-22 MED FILL — NAPROXEN 375 MG TABLET: 375 | 10 days supply | Qty: 20 | Fill #0

## 2020-06-22 MED FILL — CYCLOBENZAPRINE HCL 10 MG T: 10 | 10 days supply | Qty: 20 | Fill #0
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
## 2020-06-22 NOTE — ED Notes (Signed)
ED Provider at bedside. 
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
## 2021-10-09 ENCOUNTER — Ambulatory Visit (INDEPENDENT_AMBULATORY_CARE_PROVIDER_SITE_OTHER): Payer: Self-pay | Admitting: Primary Care

## 2021-12-04 ENCOUNTER — Ambulatory Visit (INDEPENDENT_AMBULATORY_CARE_PROVIDER_SITE_OTHER): Payer: 59

## 2021-12-04 ENCOUNTER — Encounter: Payer: Self-pay | Admitting: Orthopaedic Surgery

## 2021-12-04 ENCOUNTER — Ambulatory Visit: Payer: 59 | Admitting: Orthopaedic Surgery

## 2021-12-04 DIAGNOSIS — M7541 Impingement syndrome of right shoulder: Secondary | ICD-10-CM

## 2021-12-04 DIAGNOSIS — G8929 Other chronic pain: Secondary | ICD-10-CM

## 2021-12-04 DIAGNOSIS — M25511 Pain in right shoulder: Secondary | ICD-10-CM

## 2021-12-04 MED ORDER — LIDOCAINE HCL 1 % IJ SOLN
3.0000 mL | INTRAMUSCULAR | Status: AC | PRN
  Administered 2021-12-04: 3 mL

## 2021-12-04 MED ORDER — METHYLPREDNISOLONE ACETATE 40 MG/ML IJ SUSP
40.0000 mg | INTRAMUSCULAR | Status: AC | PRN
  Administered 2021-12-04: 40 mg via INTRA_ARTICULAR

## 2021-12-04 NOTE — Progress Notes (Signed)
? ? ? ?Office Visit Note ?  ?Patient: Natasha Mcintyre           ?Date of Birth: [birth year]           ?MRN: [id number] ?Visit Date: 12/04/2021 ?             ?Requested by: No referring provider defined for this encounter. ?PCP: System, Provider Not In ? ? ?Assessment & Plan: ?Visit Diagnoses:  ?1. Chronic right shoulder pain   ?2. Impingement syndrome of right shoulder   ? ? ?Plan: It does seem that there is signs of impingement with the right shoulder.  I recommended a steroid injection by me in the subacromial space now and she agrees to this and did tolerate it well.  I recommended over-the-counter anti-inflammatories.  I can see her back in a month to see if this has helped. ? ?Follow-Up Instructions: Return in about 4 weeks (around 01/01/2022).  ? ?Orders:  ?Orders Placed This Encounter  ?Procedures  ? Large Joint Inj  ? XR Shoulder Right  ? ?No orders of the defined types were placed in this encounter. ? ? ? ? Procedures: ?Large Joint Inj: R subacromial bursa on 12/04/2021 9:36 AM ?Indications: pain and diagnostic evaluation ?Details: 22 G 1.5 in needle ? ?Arthrogram: No ? ?Medications: 3 mL lidocaine 1 %; 40 mg methylPREDNISolone acetate 40 MG/ML ?Outcome: tolerated well, no immediate complications ?Procedure, treatment alternatives, risks and benefits explained, specific risks discussed. Consent was given by the patient. Immediately prior to procedure a time out was called to verify the correct patient, procedure, equipment, support staff and site/side marked as required. Patient was prepped and draped in the usual sterile fashion.  ? ? ? ? ?Clinical Data: ?No additional findings. ? ? ?Subjective: ?Chief Complaint  ?Patient presents with  ? Right Shoulder - Pain  ?The patient comes in for evaluation treatment of right shoulder pain has been hurting for several years now with no known injury.  She says she has had at least 1 injection years ago but she does not really remember when they did help in the  short-term.  She does report occasional numbness and tingling in her hand but is mainly around her shoulder that hurts her most.  She works as a Quarry manager.  She does not take anything for pain including over-the-counter anti-inflammatories.  She has not had any history of surgery on that right shoulder nor physical therapy.  She is not a diabetic.  She says most the pain is reaching overhead and behind her. ? ?HPI ? ?Review of Systems ?There is currently listed no headache, chest pain, shortness of breath, fever, chills, nausea, vomiting ? ?Objective: ?Vital Signs: LMP 02/27/2018  ? ?Physical Exam ?She is alert and orient x3 and in no acute distress ?Ortho Exam ?Examination of right shoulder shows it moves smoothly and fluidly but there is pain with signs of impingement and pain reaching behind and overhead but there is no blocks to rotation and no loss of strength. ?Specialty Comments:  ?No specialty comments available. ? ?Imaging: ?XR Shoulder Right ? ?Result Date: 12/04/2021 ?3 views of the right shoulder show no acute findings.  ? ? ?PMFS History: ?Patient Active Problem List  ? Diagnosis Date Noted  ? Lumbar spinal stenosis 05/06/2018  ? Fibroids 10/04/2017  ? Anemia 10/04/2017  ? ?Past Medical History:  ?Diagnosis Date  ? Anemia 09/09/2017  ? hgb 10.8  ? Arthritis   ? Complication of anesthesia   ? SHE'S NEVER HAD  GENERAL ANESTHESIA  ? Fibroids   ? Hypertension   ?  ?Family History  ?Problem Relation Age of Onset  ? Diabetes Mother   ? Hypertension Father   ?  ?Past Surgical History:  ?Procedure Laterality Date  ? BACK SURGERY    ? CARPAL TUNNEL RELEASE    ? CESAREAN SECTION    ? TUBAL LIGATION    ? ?Social History  ? ?Occupational History  ? Not on file  ?Tobacco Use  ? Smoking status: Every Day  ?  Packs/day: 0.50  ?  Types: Cigarettes  ? Smokeless tobacco: Never  ?Vaping Use  ? Vaping Use: Never used  ?Substance and Sexual Activity  ? Alcohol use: Yes  ?  Comment: occ  ? Drug use: No  ? Sexual activity: Not on file   ? ? ? ? ? ? ?

## 2022-01-08 ENCOUNTER — Ambulatory Visit: Payer: 59 | Admitting: Orthopaedic Surgery

## 2022-01-08 ENCOUNTER — Other Ambulatory Visit: Payer: Self-pay

## 2022-01-08 ENCOUNTER — Encounter: Payer: Self-pay | Admitting: Orthopaedic Surgery

## 2022-01-08 DIAGNOSIS — M7541 Impingement syndrome of right shoulder: Secondary | ICD-10-CM

## 2022-01-08 DIAGNOSIS — G8929 Other chronic pain: Secondary | ICD-10-CM | POA: Diagnosis not present

## 2022-01-08 DIAGNOSIS — M25511 Pain in right shoulder: Secondary | ICD-10-CM

## 2022-01-08 NOTE — Progress Notes (Signed)
The patient returns for follow-up as it relates to right shoulder pain and impingement.  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  I saw her 4 weeks ago and placed a steroid injection in her subacromial outlet and showed her some exercises to try.  She said the injection really helped minimal and she still has pain with reaching up and overhead and behind her.  There is been no known injury.  She has not had any formal physical therapy yet.  On exam she does still show signs of tendinitis and impingement of the right shoulder.  I would like to send her to outpatient physical therapy at this point to see if there is any modalities that can decrease her right shoulder pain and improve her mobility and function.  She agrees with this treatment plan.  We will work on getting that set up.  I will then see her back in 6 weeks.  If that does not help the next step would be considering an MRI of her right shoulder.  All questions and concerns were answered and addressed.

## 2022-01-23 NOTE — Therapy (Signed)
OUTPATIENT PHYSICAL THERAPY SHOULDER EVALUATION   Patient Name: Natasha Mcintyre MRN: [id number] DOB:10/31/1965, 56 y.o., female Today's Date: 01/24/2022   PT End of Session - 01/24/22 1014     Visit Number 1    Number of Visits 12    Date for PT Re-Evaluation 03/09/22    PT Start Time 1015    PT Stop Time 1055    PT Time Calculation (min) 40 min    Activity Tolerance Patient tolerated treatment well    Behavior During Therapy [facility] for tasks assessed/performed             Past Medical History:  Diagnosis Date   Anemia 09/09/2017   hgb 10.8   Arthritis    Complication of anesthesia    SHE'S NEVER HAD GENERAL ANESTHESIA   Fibroids    Hypertension    Past Surgical History:  Procedure Laterality Date   BACK SURGERY     CARPAL TUNNEL RELEASE     CESAREAN SECTION     TUBAL LIGATION     Patient Active Problem List   Diagnosis Date Noted   Lumbar spinal stenosis 05/06/2018   Fibroids 10/04/2017   Anemia 10/04/2017    PCP: None  REFERRING PROVIDER: Mcarthur Rossetti, MD  REFERRING DIAG: M25.511 chronic Rt shoulder pain 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 Pt stating she thought her pain was due to arthritis. Pt stating pain is caused by tendonitis. Pt stating her sleep is effected and she tosses and turns all night trying to get comfortable. Pt stating her ADL's are  difficult at times.    PERTINENT HISTORY: anemia, arthritis, HTN, back surgery, carpal tunnel release, C-section  PAIN:  Are you having pain? Yes: NPRS scale: 8/10 pain with movements 8-9 10 when trying to sleep     /10 Pain location: Rt shoulder Pain description: soreness Aggravating factors: movement, sleeping, reaching, lifting Relieving factors: resting  PRECAUTIONS: None  WEIGHT BEARING RESTRICTIONS No  FALLS:  Has patient fallen in last 6 months? 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  PATIENT SURVEYS:            01/24/22: FOTO 42 (predicted 63%)  COGNITION:  01/24/22: Overall cognitive status: Within functional limits for tasks assessed     SENSATION: 01/24/22: WFL  POSTURE: 01/24/22: rounded head and forward head  UPPER EXTREMITY ROM:   Active ROM Right Eval supine Left Eval supine  Shoulder flexion 100 150  Shoulder extension 20 36  Shoulder abduction 130 160  Shoulder adduction    Shoulder internal rotation 78 80  Shoulder external rotation 70 90  Elbow flexion 142   Elbow extension 0   Wrist flexion    Wrist extension    Wrist ulnar deviation    Wrist radial deviation    Wrist pronation    Wrist supination    (Blank rows = not tested)  UPPER EXTREMITY MMT:  MMT Right eval Left eval  Shoulder flexion 3/5 5/5  Shoulder extension 4/5 5/5  Shoulder abduction 3/5 5/5  Shoulder adduction 4/5 5/5  Shoulder internal rotation 4/5 5/5  Shoulder external rotation 3/5 5/5  Middle trapezius    Lower trapezius    Elbow flexion    Elbow extension    Wrist flexion 5/5 5/5  Wrist  extension 5/5 5/5  Wrist ulnar deviation    Wrist radial deviation    Wrist pronation    Wrist supination    Grip strength (lbs)    (Blank rows = not tested)  SHOULDER SPECIAL TESTS: 01/24/22:   Impingement tests: Neer impingement test: positive  and Hawkins/Kennedy impingement test: positive  Both positive on Rt UE      PALPATION:  01/24/22: TTP supraspinatus, anterior and middle deltoid, infraspinatus and upper trap all on Rt shoulder. Active trigger points noted in Rt upper trap and middle deltoid   TODAY'S TREATMENT:    HEP instruction/performance c cues for techniques, handout provided.  Trial set performed of each for comprehension and symptom assessment.  See below for exercise list.   PATIENT EDUCATION: Education details: PT POC, HEP, TPDN handout issued and DN discussed Person educated: Patient Education method: Explanation, Demonstration, Tactile cues, Verbal cues, and Handouts Education comprehension: verbalized understanding and returned demonstration   HOME EXERCISE PROGRAM: Access Code: [email or web address] URL: [email or web address] Date: 01/24/2022 Prepared by: Kearney Hard  Exercises - Supine Shoulder Flexion with Dowel  - 2 x daily - 7 x weekly - 2 sets - 10 reps - 3-5 seconds hold - Supine Shoulder External Rotation in 45 Degrees Abduction AAROM with Dowel  - 2 x daily - 7 x weekly - 2 sets - 10 reps - 3-5 seconds hold - Standing Row with Anchored Resistance  - 2 x daily - 7 x weekly - 2 sets - 10 reps - 3 seconds hold - Doorway Pec Stretch at 90 Degrees Abduction  - 2-3 x daily - 7 x weekly - 3 reps - 20 seconds hold  ASSESSMENT:  CLINICAL IMPRESSION: Patient is a 56 y.o. who comes to clinic with complaints of Rt shoulder pain with dx of impingement syndrome with mobility, strength and movement coordination deficits that impair their ability to perform usual daily and recreational  functional activities without increase difficulty/symptoms. Pt  stating her sleep is disturbed and she has difficulty with work related functions. 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  Patient to benefit from skilled PT services to address impairments and limitations to improve to previous level of function without restriction secondary to condition.    OBJECTIVE IMPAIRMENTS decreased mobility, decreased ROM, decreased strength, increased edema, impaired UE functional use, postural dysfunction, and pain.   ACTIVITY LIMITATIONS carrying, lifting, sleeping, dressing, and reach over head  PARTICIPATION LIMITATIONS: cleaning, laundry, shopping, community activity, and occupation  PERSONAL FACTORS anemia, arthritis, HTN, back surgery, carpal tunnel release, C-section are also affecting patient's functional outcome.   REHAB POTENTIAL: Good  CLINICAL DECISION MAKING: Stable/uncomplicated  EVALUATION COMPLEXITY: Low   GOALS: Goals reviewed with patient? Yes  SHORT TERM GOALS: Target date: 02/14/2022   Patient will demonstrate independent use of initial home exercise program to maintain progress from in clinic treatments. Goal status: New   Long term PT goals : target dates 03/02/2022  Patient will demonstrate/report pain at worst less than or equal to 2/10 to facilitate minimal limitation in daily activity secondary to pain symptoms. Goal status: New   Patient will demonstrate independent use of home exercise program to facilitate ability to maintain/progress functional gains from skilled physical therapy services. Goal status: New   Patient will demonstrate FOTO outcome > or = 63 % to indicate reduced disability due to condition. Goal status: New   Pt will be able to lift a 10# object from floor to over head using correct body mechanics with pain </=2/10 in Rt UE.  Goal status: New       5.  Pt will improve her Rt shoulder flexion and abduction to >/= 150 degrees for improved  functional mobility.   Goal status: New        6. Pt will improve her Rt shoulder strength to >/= 4/5 in order to improve functional mobility.  PLAN: PT FREQUENCY: 2x/week  PT DURATION: 6 weeks  PLANNED INTERVENTIONS: Therapeutic exercises, Therapeutic activity, Neuromuscular re-education, Balance training, Gait training, Patient/Family education, Joint mobilization, Dry Needling, Spinal mobilization, Cryotherapy, Moist heat, Taping, Vasopneumatic device, Ultrasound, and Manual therapy  PLAN FOR NEXT SESSION: ROM, shoulder mobs, assess DN   Oretha Caprice, PT, MPT 01/24/2022, 10:15 AM

## 2022-01-24 ENCOUNTER — Ambulatory Visit: Payer: 59 | Admitting: Physical Therapy

## 2022-01-24 ENCOUNTER — Encounter: Payer: Self-pay | Admitting: Physical Therapy

## 2022-01-24 DIAGNOSIS — M25611 Stiffness of right shoulder, not elsewhere classified: Secondary | ICD-10-CM | POA: Diagnosis not present

## 2022-01-24 DIAGNOSIS — G8929 Other chronic pain: Secondary | ICD-10-CM

## 2022-01-24 DIAGNOSIS — M6281 Muscle weakness (generalized): Secondary | ICD-10-CM

## 2022-01-24 DIAGNOSIS — M25511 Pain in right shoulder: Secondary | ICD-10-CM | POA: Diagnosis not present

## 2022-01-31 ENCOUNTER — Encounter: Payer: 59 | Admitting: Physical Therapy

## 2022-01-31 ENCOUNTER — Telehealth: Payer: Self-pay | Admitting: Physical Therapy

## 2022-01-31 NOTE — Telephone Encounter (Signed)
Pt did not show for PT appointment today. They were contacted and informed of this via voicemail. They were provided the date and time of their next appointment on voicemail. They were instructed to call us to let us know if they cannot make their appointment.   Elsie Ra, PT, DPT 01/31/22 9:48 AM

## 2022-02-06 ENCOUNTER — Encounter: Payer: 59 | Admitting: Physical Therapy

## 2022-02-08 ENCOUNTER — Encounter: Payer: 59 | Admitting: Physical Therapy

## 2022-02-12 ENCOUNTER — Encounter: Payer: 59 | Admitting: Physical Therapy

## 2022-02-14 ENCOUNTER — Ambulatory Visit (INDEPENDENT_AMBULATORY_CARE_PROVIDER_SITE_OTHER): Payer: 59 | Admitting: Physical Therapy

## 2022-02-14 ENCOUNTER — Encounter: Payer: Self-pay | Admitting: Physical Therapy

## 2022-02-14 DIAGNOSIS — M25611 Stiffness of right shoulder, not elsewhere classified: Secondary | ICD-10-CM

## 2022-02-14 DIAGNOSIS — G8929 Other chronic pain: Secondary | ICD-10-CM

## 2022-02-14 DIAGNOSIS — M6281 Muscle weakness (generalized): Secondary | ICD-10-CM | POA: Diagnosis not present

## 2022-02-14 DIAGNOSIS — M25511 Pain in right shoulder: Secondary | ICD-10-CM | POA: Diagnosis not present

## 2022-02-14 NOTE — Therapy (Signed)
OUTPATIENT PHYSICAL THERAPY TREATMENT NOTE   Patient Name: Natasha Mcintyre MRN: [id number] DOB:Dec 20, 1965, 56 y.o., female Today's Date: 02/14/2022  PCP: none REFERRING PROVIDER: Mcarthur Rossetti, MD  END OF SESSION:   PT End of Session - 02/14/22 0850     Visit Number 2    Number of Visits 12    Date for PT Re-Evaluation 03/09/22    PT Start Time 0845    PT Stop Time 0930    PT Time Calculation (min) 45 min    Activity Tolerance Patient tolerated treatment well    Behavior During Therapy [facility] for tasks assessed/performed             Past Medical History:  Diagnosis Date   Anemia 09/09/2017   hgb 10.8   Arthritis    Complication of anesthesia    SHE'S NEVER HAD GENERAL ANESTHESIA   Fibroids    Hypertension    Past Surgical History:  Procedure Laterality Date   [place]     Patient Active Problem List   Diagnosis Date Noted   Lumbar spinal stenosis 05/06/2018   Fibroids 10/04/2017   Anemia 10/04/2017    REFERRING DIAG: M25.511 chronic Rt shoulder pain 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   PAIN:  Are you having pain? Yes: NPRS scale: 6/10 Pain location: Rt shoulder Pain description: achy Aggravating factors: activities Relieving factors: resting   OBJECTIVE: (objective measures completed at initial evaluation unless otherwise dated)  DIAGNOSTIC FINDINGS:    3 views of the right shoulder show no acute findings.   PATIENT SURVEYS:            01/24/22: FOTO 42 (predicted 63%)   COGNITION:            01/24/22: Overall cognitive status: Within functional limits for tasks assessed                                  SENSATION: 01/24/22: WFL   POSTURE: 01/24/22: rounded head and forward head   UPPER EXTREMITY ROM:    Active ROM Right Eval 01/24/22 Left Eval 01/24/22  Shoulder flexion 100 150  Shoulder extension 20 36  Shoulder abduction 130 160  Shoulder adduction      Shoulder internal rotation 78 80  Shoulder external rotation 70 90  Elbow flexion 142    Elbow extension 0    Wrist flexion      Wrist extension      Wrist ulnar deviation      Wrist radial deviation      Wrist pronation      Wrist supination      (Blank rows = not tested)   UPPER EXTREMITY MMT:   MMT Right 01/24/22 Left 01/24/22  Shoulder flexion 3/5 5/5  Shoulder extension 4/5 5/5  Shoulder abduction 3/5 5/5  Shoulder adduction 4/5 5/5  Shoulder internal rotation 4/5 5/5  Shoulder external rotation 3/5 5/5  Middle trapezius      Lower trapezius  Elbow flexion      Elbow extension      Wrist flexion 5/5 5/5  Wrist extension 5/5 5/5  Wrist ulnar deviation      Wrist radial deviation      Wrist pronation      Wrist supination      Grip strength (lbs)      (Blank rows = not tested)   SHOULDER SPECIAL TESTS: 01/24/22:             Impingement tests: Neer impingement test: positive  and Hawkins/Kennedy impingement test: positive  Both positive on Rt UE                   PALPATION:  01/24/22: TTP supraspinatus, anterior and middle deltoid, infraspinatus and upper trap all on Rt shoulder. Active trigger points noted in Rt upper trap and middle deltoid             TODAY'S TREATMENT:  02/14/22: TherEx:  Pulleys: 3 minutes flexion, 2 minutes abduction Rows: Level 3 band x 20 Shoulder flexion c 2# bar x 20 Shoulder extension c 2# bar x 15 Manual: GH AP grade 2-3 shoulder mobs, skilled palpation during TPDN Trigger Point Dry-Needling  Treatment instructions: Expect mild to moderate muscle  soreness. S/S of pneumothorax if dry needled over a lung field, and to seek immediate medical attention should they occur. Patient verbalized understanding of these instructions and education.  Patient Consent Given: Yes Education handout provided: Yes, previous visit Muscles treated: Anterior and middle deltoid on Rt shoulder Treatment response/outcome: multiple twitch responses noted Modalities:  Moist heat to Rt shoulder x 5 minutes     01/24/22: HEP instruction/performance c cues for techniques, handout provided.  Trial set performed of each for comprehension and symptom assessment.  See below for exercise list.     PATIENT EDUCATION: Education details: PT POC, HEP, TPDN handout issued and DN discussed Person educated: Patient Education method: Explanation, Demonstration, Tactile cues, Verbal cues, and Handouts Education comprehension: verbalized understanding and returned demonstration     HOME EXERCISE PROGRAM: Access Code: [email or web address] URL: [email or web address] Date: 01/24/2022 Prepared by: Kearney Hard   Exercises - Supine Shoulder Flexion with Dowel  - 2 x daily - 7 x weekly - 2 sets - 10 reps - 3-5 seconds hold - Supine Shoulder External Rotation in 45 Degrees Abduction AAROM with Dowel  - 2 x daily - 7 x weekly - 2 sets - 10 reps - 3-5 seconds hold - Standing Row with Anchored Resistance  - 2 x daily - 7 x weekly - 2 sets - 10 reps - 3 seconds hold - Doorway Pec Stretch at 90 Degrees Abduction  - 2-3 x daily - 7 x weekly - 3 reps - 20 seconds hold   ASSESSMENT:   CLINICAL IMPRESSION: Pt arriving c 6/10 pain in her Rt shoulder. Pt stating she hasn't been as consistent in her HEP due to going on vacation. Pt agreeing to [facility] for active trigger points. Pt with good response noted when needling her Rt anterior and middle deltoid. Pt tolerating all exercises well. Continue skilled PT to maximize pt's function.     OBJECTIVE IMPAIRMENTS decreased mobility,  decreased ROM, decreased strength, increased edema, impaired UE functional use, postural dysfunction, and pain.    ACTIVITY LIMITATIONS carrying, lifting, sleeping, dressing, and reach over head   PARTICIPATION LIMITATIONS: cleaning, laundry, shopping, community activity, and occupation   PERSONAL FACTORS anemia, arthritis, HTN, back surgery, carpal tunnel release, C-section are also  affecting patient's functional outcome.    REHAB POTENTIAL: Good   CLINICAL DECISION MAKING: Stable/uncomplicated   EVALUATION COMPLEXITY: Low     GOALS: Goals reviewed with patient? Yes   SHORT TERM GOALS: Target date: 02/14/2022   Patient will demonstrate independent use of initial home exercise program to maintain progress from in clinic treatments. Goal status:MET 02/14/2022   Long term PT goals : target dates 03/02/2022  Patient will demonstrate/report pain at worst less than or equal to 2/10 to facilitate minimal limitation in daily activity secondary to pain symptoms. Goal status: New   Patient will demonstrate independent use of home exercise program to facilitate ability to maintain/progress functional gains from skilled physical therapy services. Goal status: New   Patient will demonstrate FOTO outcome > or = 63 % to indicate reduced disability due to condition. Goal status: New   Pt will be able to lift a 10# object from floor to over head using correct body mechanics with pain </=2/10 in Rt UE.  Goal status: New       5.  Pt will improve her Rt shoulder flexion and abduction to >/= 150 degrees for improved functional mobility.   Goal status: New         6. Pt will improve her Rt shoulder strength to >/= 4/5 in order to improve functional mobility.  PLAN: PT FREQUENCY: 2x/week   PT DURATION: 6 weeks   PLANNED INTERVENTIONS: Therapeutic exercises, Therapeutic activity, Neuromuscular re-education, Balance training, Gait training, Patient/Family education, Joint mobilization, Dry Needling,  Spinal mobilization, Cryotherapy, Moist heat, Taping, Vasopneumatic device, Ultrasound, and Manual therapy   PLAN FOR NEXT SESSION: progress pt's HEP, ROM, shoulder mobs, assess response to DN, begin more strengthening         Oretha Caprice, PT, MPT 02/14/2022, 9:20 AM

## 2022-02-19 ENCOUNTER — Other Ambulatory Visit: Payer: Self-pay

## 2022-02-19 ENCOUNTER — Encounter: Payer: Self-pay | Admitting: Physical Therapy

## 2022-02-19 ENCOUNTER — Encounter: Payer: Self-pay | Admitting: Orthopaedic Surgery

## 2022-02-19 ENCOUNTER — Ambulatory Visit (INDEPENDENT_AMBULATORY_CARE_PROVIDER_SITE_OTHER): Payer: 59 | Admitting: Physical Therapy

## 2022-02-19 ENCOUNTER — Ambulatory Visit: Payer: 59 | Admitting: Orthopaedic Surgery

## 2022-02-19 DIAGNOSIS — M7541 Impingement syndrome of right shoulder: Secondary | ICD-10-CM | POA: Diagnosis not present

## 2022-02-19 DIAGNOSIS — G8929 Other chronic pain: Secondary | ICD-10-CM

## 2022-02-19 DIAGNOSIS — M25511 Pain in right shoulder: Secondary | ICD-10-CM

## 2022-02-19 DIAGNOSIS — M25611 Stiffness of right shoulder, not elsewhere classified: Secondary | ICD-10-CM | POA: Diagnosis not present

## 2022-02-19 DIAGNOSIS — M6281 Muscle weakness (generalized): Secondary | ICD-10-CM

## 2022-02-19 NOTE — Progress Notes (Signed)
The patient comes in today with continued right shoulder pain.  She has tried and failed all forms of conservative treatment for her right shoulder at this point.  She is actively in physical therapy for that shoulder and has had activity modification as well as anti-inflammatories orally.  She has had a steroid injection in the right shoulder subacromial outlet.  None of those things have helped her.  On exam her right shoulder moves smoothly and fluidly but it hurts with abduction and external rotation.  Internal rotation with adduction is also painful to her.  Given the failure of conservative treatment including rest, anti-inflammatories, steroid injections and formal outpatient physical therapy, a MRI of the right shoulder is warranted to assess the rotator cuff and for any other pathology that is causing her to have pain and discomfort with that right shoulder.  She agrees with this treatment plan.  We will see her back once we have this MRI.  She will continue physical therapy for now as well.

## 2022-02-19 NOTE — Therapy (Addendum)
OUTPATIENT PHYSICAL THERAPY TREATMENT NOTE Discharge Summary   Patient Name: Natasha Mcintyre MRN: [id number] DOB:18-Nov-1965, 56 y.o., female Today's Date: 02/19/2022  PCP: none REFERRING PROVIDER: Mcarthur Rossetti, MD  END OF SESSION:   PT End of Session - 02/19/22 0952     Visit Number 3    Number of Visits 12    Date for PT Re-Evaluation 03/09/22    PT Start Time 0912    PT Stop Time 0955    PT Time Calculation (min) 43 min    Activity Tolerance Patient tolerated treatment well    Behavior During Therapy [facility] for tasks assessed/performed              Past Medical History:  Diagnosis Date   Anemia 09/09/2017   hgb 10.8   Arthritis    Complication of anesthesia    SHE'S NEVER HAD GENERAL ANESTHESIA   Fibroids    Hypertension    Past Surgical History:  Procedure Laterality Date   [place]     Patient Active Problem List   Diagnosis Date Noted   Lumbar spinal stenosis 05/06/2018   Fibroids 10/04/2017   Anemia 10/04/2017    REFERRING DIAG: M25.511 chronic Rt shoulder pain 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 Pt stating she feels like she needs an MRI to figure out what is wrong.  PAIN:  Are you having pain? Yes: NPRS scale: 7/10 Pain location: Rt shoulder Pain description: achy Aggravating factors: activities Relieving factors: resting   OBJECTIVE: (objective measures completed at initial evaluation unless otherwise dated)  DIAGNOSTIC FINDINGS:    3 views of the right shoulder show no acute findings.   PATIENT SURVEYS:             01/24/22: FOTO 42 (predicted 63%)   COGNITION:           01/24/22: Overall cognitive status: Within functional limits for tasks assessed                                  SENSATION: 01/24/22: WFL   POSTURE: 01/24/22: rounded head and forward head   UPPER EXTREMITY ROM:    Active ROM Right Eval 01/24/22 Left Eval 01/24/22 Rt 02/19/22  Shoulder flexion 100 150 148  Shoulder extension 20 36 35  Shoulder abduction 130 160 150  Shoulder adduction       Shoulder internal rotation 78 80 78  Shoulder external rotation 70 90 80  Elbow flexion 142     Elbow extension 0     Wrist flexion       Wrist extension       Wrist ulnar deviation       Wrist radial deviation       Wrist pronation       Wrist supination       (Blank rows = not tested)   UPPER EXTREMITY MMT:   MMT Right 01/24/22 Left 01/24/22  Shoulder flexion 3/5 5/5  Shoulder extension 4/5 5/5  Shoulder abduction 3/5 5/5  Shoulder adduction 4/5 5/5  Shoulder internal rotation 4/5 5/5  Shoulder external rotation 3/5 5/5  Middle trapezius      Lower trapezius      Elbow flexion      Elbow extension      Wrist flexion 5/5 5/5  Wrist extension 5/5 5/5  Wrist ulnar deviation      Wrist radial deviation      Wrist pronation      Wrist supination      Grip strength (lbs)      (Blank rows = not tested)   SHOULDER SPECIAL TESTS: 01/24/22:             Impingement tests: Neer impingement test: positive  and Hawkins/Kennedy impingement test: positive  Both positive on Rt UE                   PALPATION:  01/24/22: TTP supraspinatus, anterior and middle deltoid, infraspinatus and upper trap all on Rt shoulder. Active trigger points noted in Rt upper trap and middle deltoid               TODAY'S TREATMENT:  02/19/22: TherEx:  Pulleys: 2 minutes abduction UBE: level 1, 2 minutes each direction Rows: Level 3 band 2 x 15 Shoulder flexion c 2# bar x 20 Shoulder extension c 2# bar 2 x 15 Shoulder abduction c 1 # x  15 Manual: GH AP grade 2-3 shoulder mobs, skilled palpation during TPDN Trigger Point Dry-Needling  Treatment instructions: Expect mild to moderate muscle soreness. S/S of pneumothorax if dry needled over a lung field, and to seek immediate medical attention should they occur. Patient verbalized understanding of these instructions and education.  Patient Consent Given: Yes Education handout provided: Yes, previous visit Muscles treated: anterior, posterior, and middle deltoid on Rt shoulder Treatment response/outcome: multiple twitch responses noted Modalities:  Moist heat to Rt shoulder x 5 minutes     02/14/22: TherEx:  Pulleys: 3 minutes flexion, 2 minutes abduction Rows: Level 3 band x 20 Shoulder flexion c 2# bar x 20 Shoulder extension c 2# bar x 15 Manual: GH AP grade 2-3 shoulder mobs, skilled palpation during TPDN Trigger Point Dry-Needling  Treatment instructions: Expect mild to moderate muscle soreness. S/S of pneumothorax if dry needled over a lung field, and to seek immediate medical attention should they occur. Patient verbalized understanding of these instructions and education.  Patient Consent Given: Yes Education handout provided: Yes, previous visit Muscles treated: Anterior and middle deltoid on Rt shoulder Treatment response/outcome: multiple twitch responses noted Modalities:  Moist heat to Rt shoulder x 5 minutes     01/24/22: HEP instruction/performance c cues for techniques, handout provided.  Trial set performed of each for comprehension and symptom assessment.  See below for exercise list.     PATIENT EDUCATION: Education details: PT POC, HEP, TPDN handout issued and DN discussed Person educated: Patient Education method: Explanation, Demonstration, Tactile cues, Verbal cues, and Handouts Education comprehension: verbalized understanding and returned demonstration     HOME EXERCISE PROGRAM: Access Code: [email or web address] URL:  [email or web address] Date: 01/24/2022 Prepared by: Kearney Hard   Exercises - Supine Shoulder Flexion with Dowel  - 2 x daily - 7 x weekly - 2 sets - 10 reps - 3-5 seconds hold - Supine Shoulder External Rotation in 45 Degrees Abduction AAROM with Dowel  - 2 x daily - 7 x weekly - 2 sets - 10 reps - 3-5 seconds hold - Standing Row with Anchored Resistance  - 2 x daily -  7 x weekly - 2 sets - 10 reps - 3 seconds hold - Doorway Pec Stretch at 90 Degrees Abduction  - 2-3 x daily - 7 x weekly - 3 reps - 20 seconds hold   ASSESSMENT:   CLINICAL IMPRESSION: Pt arriving c 7/10 pain in her Rt shoulder. Pt stating she was sore following her last appointment after TPDN. Pt wishing to try TPDN again this visit for her active trigger points. Pt tolerating all exercises well and stating she is going to push for an MRI of her Rt shoulder. Pt has improved her Rt shoulder ROM from her initial evaluation, however still reporting pain. Continue skilled PT to maximize pt's function.     OBJECTIVE IMPAIRMENTS decreased mobility, decreased ROM, decreased strength, increased edema, impaired UE functional use, postural dysfunction, and pain.    ACTIVITY LIMITATIONS carrying, lifting, sleeping, dressing, and reach over head   PARTICIPATION LIMITATIONS: cleaning, laundry, shopping, community activity, and occupation   PERSONAL FACTORS anemia, arthritis, HTN, back surgery, carpal tunnel release, C-section are also affecting patient's functional outcome.    REHAB POTENTIAL: Good   CLINICAL DECISION MAKING: Stable/uncomplicated   EVALUATION COMPLEXITY: Low     GOALS: Goals reviewed with patient? Yes   SHORT TERM GOALS: Target date: 02/14/2022   Patient will demonstrate independent use of initial home exercise program to maintain progress from in clinic treatments. Goal status:MET 02/14/2022   Long term PT goals : target dates 03/02/2022  Patient will demonstrate/report pain at worst less  than or equal to 2/10 to facilitate minimal limitation in daily activity secondary to pain symptoms. Goal status: New   Patient will demonstrate independent use of home exercise program to facilitate ability to maintain/progress functional gains from skilled physical therapy services. Goal status: New   Patient will demonstrate FOTO outcome > or = 63 % to indicate reduced disability due to condition. Goal status: New   Pt will be able to lift a 10# object from floor to over head using correct body mechanics with pain </=2/10 in Rt UE.  Goal status: New       5.  Pt will improve her Rt shoulder flexion and abduction to >/= 150 degrees for improved functional mobility.   Goal status: New         6. Pt will improve her Rt shoulder strength to >/= 4/5 in order to improve functional mobility.  PLAN: PT FREQUENCY: 2x/week   PT DURATION: 6 weeks   PLANNED INTERVENTIONS: Therapeutic exercises, Therapeutic activity, Neuromuscular re-education, Balance training, Gait training, Patient/Family education, Joint mobilization, Dry Needling, Spinal mobilization, Cryotherapy, Moist heat, Taping, Vasopneumatic device, Ultrasound, and Manual therapy   PLAN FOR NEXT SESSION: assess response to DN, progress pt's HEP, ROM, shoulder mobs, strengthening         Oretha Caprice, PT, MPT 02/19/2022, 9:53 AM   PHYSICAL THERAPY DISCHARGE SUMMARY  Visits from Start of Care: 3  Current functional level related to goals / functional outcomes: See above   Remaining deficits: See above   Education / Equipment: HEP   Patient agrees to discharge. Patient goals were not met. Patient is being discharged due to not returning since the last visit.

## 2022-02-21 ENCOUNTER — Encounter: Payer: 59 | Admitting: Physical Therapy

## 2022-02-21 ENCOUNTER — Telehealth: Payer: Self-pay | Admitting: Physical Therapy

## 2022-02-21 NOTE — Telephone Encounter (Signed)
I called pt after she missed her 9:30 PT appointment today. Pt stated she worked a double shift last night and she fell asleep.  Kearney Hard, PT, MPT 02/21/22 10:05 AM

## 2022-03-07 ENCOUNTER — Encounter: Payer: Self-pay | Admitting: Physician Assistant

## 2022-03-07 ENCOUNTER — Ambulatory Visit (INDEPENDENT_AMBULATORY_CARE_PROVIDER_SITE_OTHER): Payer: 59

## 2022-03-07 ENCOUNTER — Ambulatory Visit (INDEPENDENT_AMBULATORY_CARE_PROVIDER_SITE_OTHER): Payer: 59 | Admitting: Physician Assistant

## 2022-03-07 DIAGNOSIS — M25562 Pain in left knee: Secondary | ICD-10-CM

## 2022-03-07 MED ORDER — LIDOCAINE HCL 1 % IJ SOLN
3.0000 mL | INTRAMUSCULAR | Status: AC | PRN
  Administered 2022-03-07: 3 mL

## 2022-03-07 MED ORDER — METHYLPREDNISOLONE ACETATE 40 MG/ML IJ SUSP
40.0000 mg | INTRAMUSCULAR | Status: AC | PRN
  Administered 2022-03-07: 40 mg via INTRA_ARTICULAR

## 2022-03-07 NOTE — Progress Notes (Signed)
Office Visit Note   Patient: Natasha Mcintyre           Date of Birth: June 08, 1966           MRN: [id number] Visit Date: 03/07/2022              Requested by: No referring provider defined for this encounter. PCP: System, Provider Not In   Assessment & Plan: Visit Diagnoses:  1. Acute pain of left knee     Plan:  Will have her work on quad strengthening exercises as discussed.  Discussed knee friendly exercises with her.  I did see her back in 2 weeks she is to be mindful of any mechanical symptoms in the interim.  Questions were encouraged and answered at length.  Ace wrap was applied she will remove this this evening before returning to bed.   Follow-Up Instructions: Return in about 2 weeks (around 03/21/2022).   Orders:  Orders Placed This Encounter  Procedures   Large Joint Inj   XR Knee 1-2 Views Left   No orders of the defined types were placed in this encounter.     Procedures: Large Joint Inj: L knee on 03/07/2022 9:48 AM Indications: pain Details: 22 G 1.5 in needle, anterolateral approach  Arthrogram: No  Medications: 3 mL lidocaine 1 %; 40 mg methylPREDNISolone acetate 40 MG/ML Aspirate: 12 mL yellow Outcome: tolerated well, no immediate complications Procedure, treatment alternatives, risks and benefits explained, specific risks discussed. Consent was given by the patient. Immediately prior to procedure a time out was called to verify the correct patient, procedure, equipment, support staff and site/side marked as required. Patient was prepped and draped in the usual sterile fashion.       Clinical Data: No additional findings.   Subjective: Chief Complaint  Patient presents with   Left Knee - Pain    HPI Patient is 56 year old female who is known to Dr. Delilah Shan.  Comes in today with new complaint of left knee pain has been ongoing for the past 2 weeks.  No particular injury.  She has pain with the knee bent for prolonged periods of time going up  and down stairs.  States pain shoots down to her ankle.  Notes the knee feels as if it locks up at times and also gives way.  She notes some swelling in the knee.  She has tried ice and ace wrap.  She is on meloxicam.   Review of Systems  Constitutional:  Negative for chills and fever.     Objective: Vital Signs: LMP 02/27/2018   Physical Exam Constitutional:      Appearance: She is not ill-appearing or diaphoretic.  Pulmonary:     Effort: Pulmonary effort is normal.  Neurological:     Mental Status: She is alert and oriented to person, place, and time.  Psychiatric:        Mood and Affect: Mood normal.     Ortho Exam Bilateral knees full range of motion of both knees.  Slight patellofemoral crepitus left knee.  No abnormal warmth or erythema of either knee.  Slight effusion left knee only.  Global tenderness left knee right knee no tenderness throughout.  Lamount Cranker is positive on the left negative on the right.  No instability valgus varus stressing of either knee.  Manipulation of the left patella causes discomfort and pain.  Tenderness along the lateral joint line of the left knee.  Specialty Comments:  No specialty comments available.  Imaging: XR Knee  1-2 Views Left  Result Date: 03/07/2022 Left knee 2 views: No acute fracture.  Chondroid calcinosis lateral compartment.  Periarticular spurring off the lateral compartment.  Mild narrowing medial lateral joint line.  Moderate patellofemoral changes.  No acute fractures.  Knee is well located.    PMFS History: Patient Active Problem List   Diagnosis Date Noted   Lumbar spinal stenosis 05/06/2018   Fibroids 10/04/2017   Anemia 10/04/2017   Past Medical History:  Diagnosis Date   Anemia 09/09/2017   hgb 10.8   Arthritis    Complication of anesthesia    SHE'S NEVER HAD GENERAL ANESTHESIA   Fibroids    Hypertension     Family History  Problem Relation Age of Onset   Diabetes Mother    Hypertension Father      Past Surgical History:  Procedure Laterality Date   BACK SURGERY     CARPAL TUNNEL RELEASE     CESAREAN SECTION     TUBAL LIGATION     Social History   Occupational History   Not on file  Tobacco Use   Smoking status: Every Day    Packs/day: 0.50    Types: Cigarettes   Smokeless tobacco: Never  Vaping Use   Vaping Use: Never used  Substance and Sexual Activity   Alcohol use: Yes    Comment: occ   Drug use: No   Sexual activity: Not on file

## 2022-03-26 ENCOUNTER — Ambulatory Visit: Payer: 59 | Admitting: Physician Assistant

## 2022-10-13 IMAGING — CR DG LUMBAR SPINE COMPLETE 4+V
5 series · 5 of 5 positions shown · non-contrast
Comparison: 05/29/2018

CLINICAL DATA: Back trauma

EXAM:
LUMBAR SPINE - COMPLETE 4+ VIEW

[t l-spine a.p.]
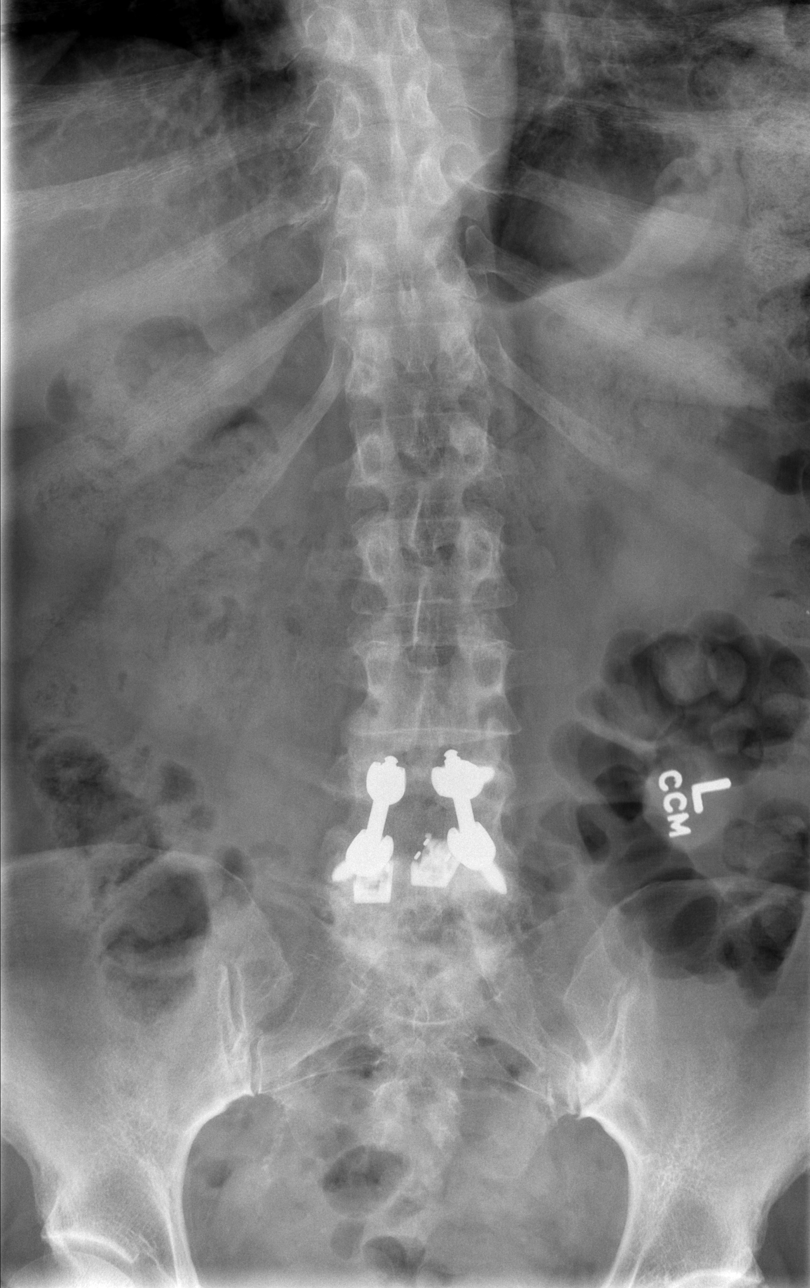

[t l-spine oblique exposure (1 of 2)]
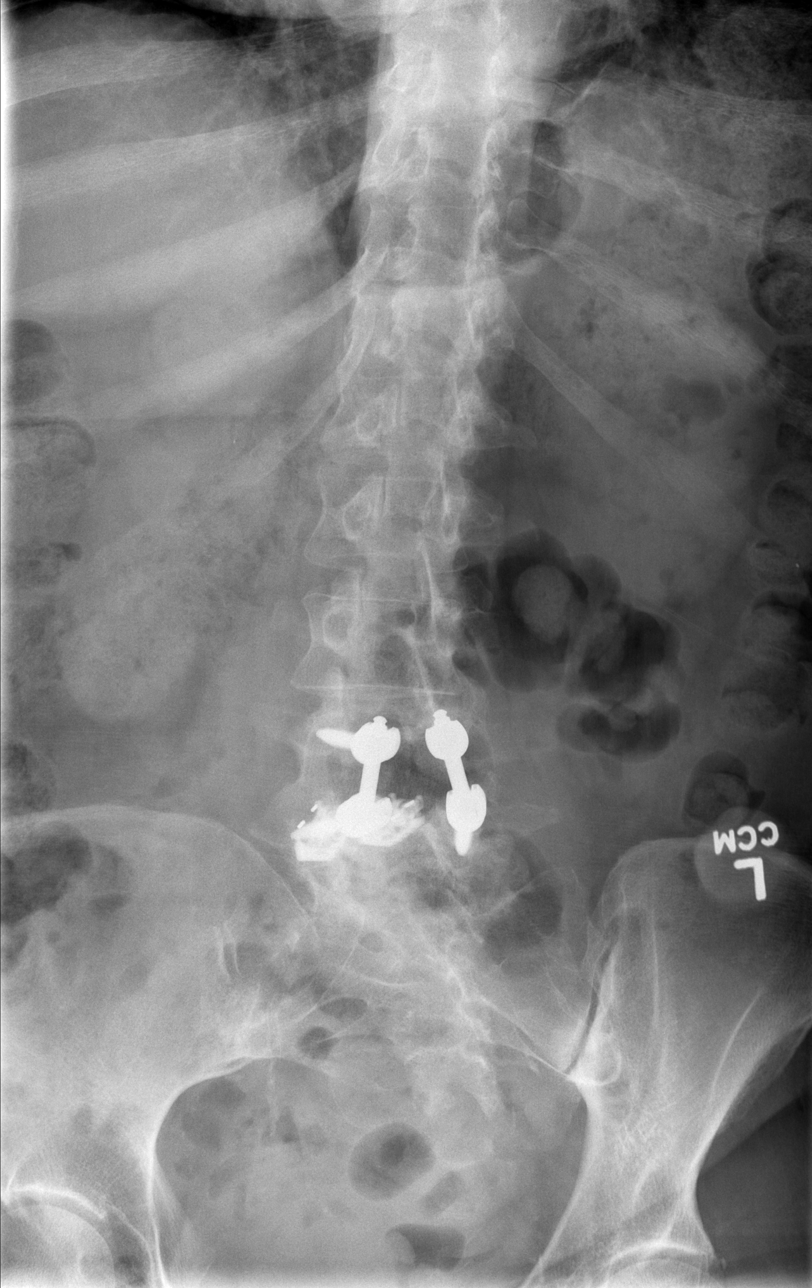

[t l-spine oblique exposure (2 of 2)]
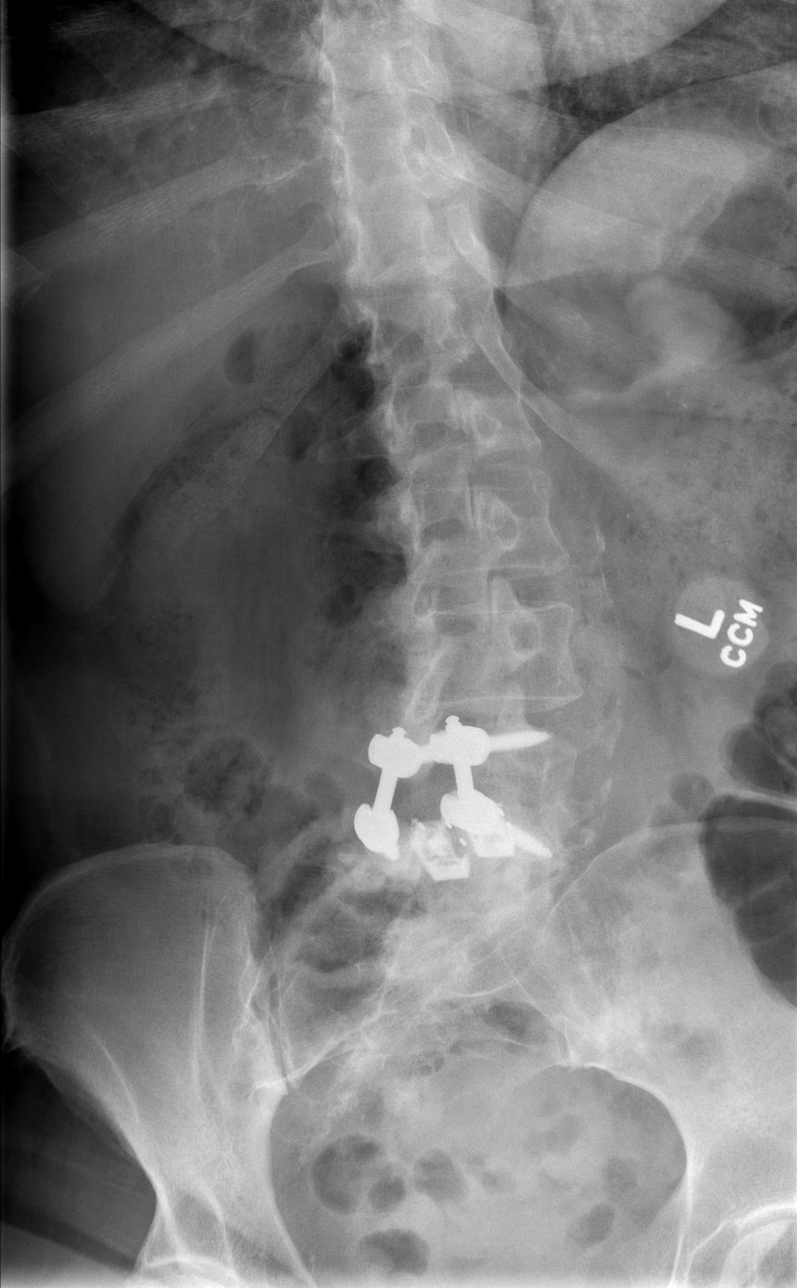

[t l-spine lat]
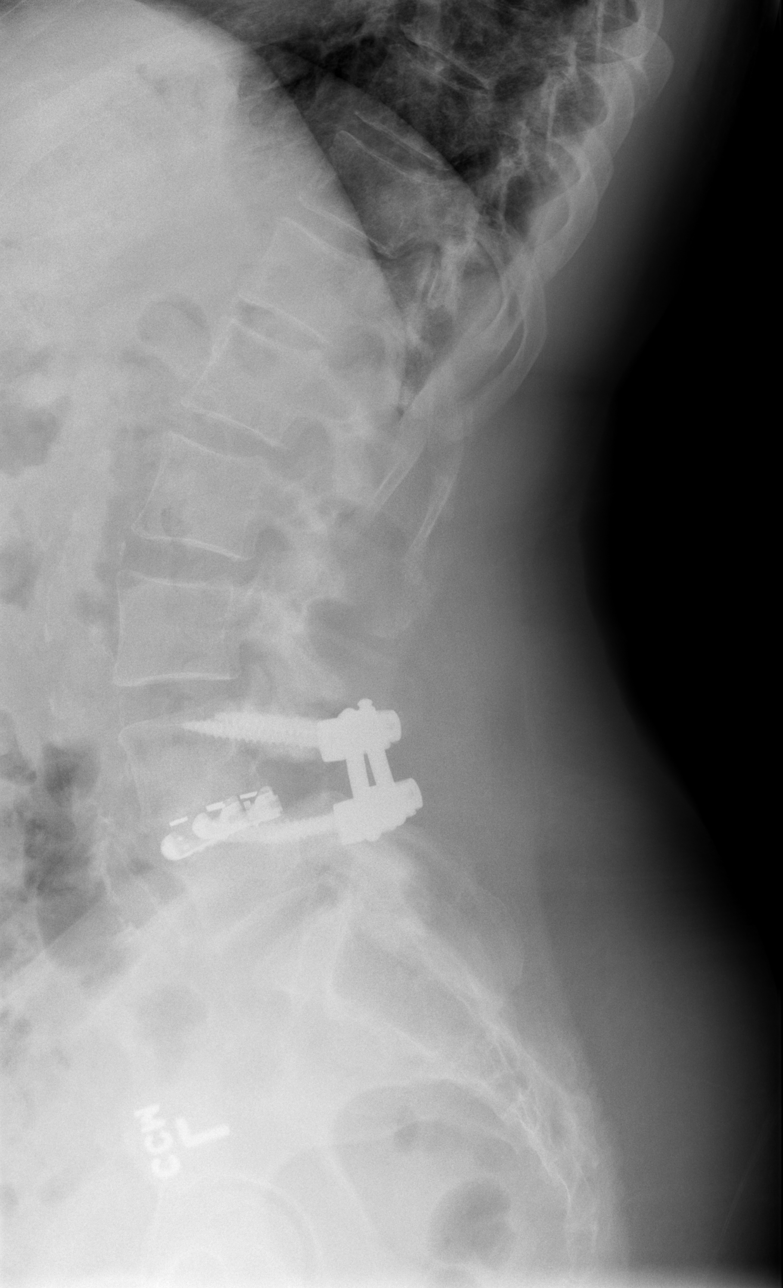

[t l-spine l5-s1 spot]
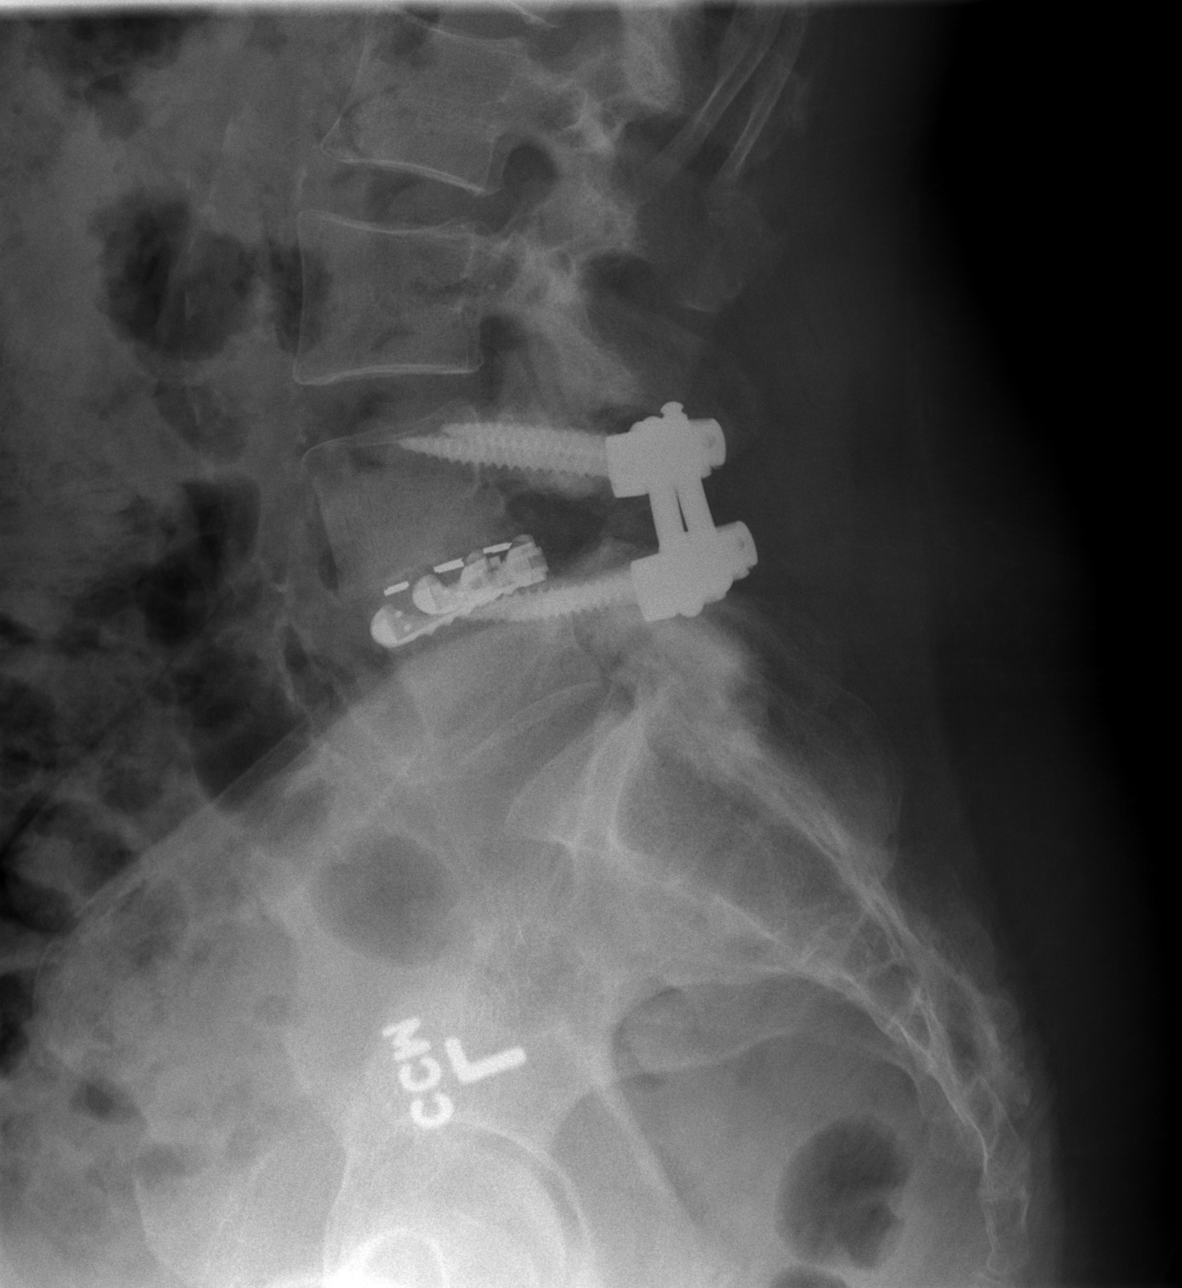

[5 of 5 positions shown; findings below may reference images not displayed]

FINDINGS: Stable lumbar alignment. Status post posterior rods, fixating screws
and interbody device at L4 and L5. Similar appearance of hardware.
Surgical screws at L5 approach or overlie the superior endplate of
L5 but this is a chronic finding. Vertebral body heights are
maintained. There are mild degenerative changes at L5-S1. There is
aortic atherosclerosis
IMPRESSION: No significant interval change. Postsurgical changes at L4-L5. No
acute osseous abnormality.

## 2022-10-22 ENCOUNTER — Other Ambulatory Visit: Payer: Self-pay

## 2022-10-22 ENCOUNTER — Emergency Department (HOSPITAL_COMMUNITY)
Admission: EM | Admit: 2022-10-22 | Discharge: 2022-10-22 | Disposition: A | Discharge status: Home / Self Care | Payer: BLUE CROSS/BLUE SHIELD | Attending: Emergency Medicine | Admitting: Emergency Medicine

## 2022-10-22 ENCOUNTER — Encounter (HOSPITAL_COMMUNITY): Payer: Self-pay

## 2022-10-22 DIAGNOSIS — H9201 Otalgia, right ear: Secondary | ICD-10-CM | POA: Insufficient documentation

## 2022-10-22 NOTE — ED Triage Notes (Signed)
Pt arrived POV from home c/o right ear pain, ringing in the ear and loss of hearing x1 month. Pt states she was initially given amoxicillin and tylenol, she then went and seen someone else who have her ear drops, but the ear is not better.

## 2022-10-22 NOTE — Discharge Instructions (Addendum)
You were seen today for a earache of the right ear.  You appear to have a infection induced tympanic membrane rupture.  Please continue your Ciprodex drops and follow-up with [facility] otolaryngology/ENT attached above.

## 2022-10-22 NOTE — ED Provider Notes (Signed)
[facility] Provider Note   CSN: [id number] Arrival date & time: 10/22/22  0549     History Chief Complaint  Patient presents with   Otalgia    HPI Natasha Mcintyre is a 57 y.o. female presenting for chief complaint of right ear pain over the last 2 months.  She is been followed by her PCP and an urgent care in the outpatient setting states that 3 weeks ago she had a right ear infection was started on amoxicillin but this was complicated by TM rupture.  She states the pain and swelling has all gone away but she has had persistent tinnitus in the right ear as well as discomfort out of the right ear.  States she does not hear things normally.  Denies fevers chills nausea vomiting syncope shortness of breath..   Patient's recorded medical, surgical, social, medication list and allergies were reviewed in the Snapshot window as part of the initial history.   Review of Systems   Review of Systems  Constitutional:  Negative for chills and fever.  HENT:  Positive for ear pain. Negative for sore throat.   Eyes:  Negative for pain and visual disturbance.  Respiratory:  Negative for cough and shortness of breath.   Cardiovascular:  Negative for chest pain and palpitations.  Gastrointestinal:  Negative for abdominal pain and vomiting.  Genitourinary:  Negative for dysuria and hematuria.  Musculoskeletal:  Negative for arthralgias and back pain.  Skin:  Negative for color change and rash.  Neurological:  Negative for seizures and syncope.  All other systems reviewed and are negative.   Physical Exam Updated Vital Signs BP 124/69 (BP Location: Right Arm)   Pulse 77   Temp (!) 97.5 F (36.4 C)   Resp 18   Ht 5\' 4"  (1.626 m)   Wt 77.1 kg   LMP 02/27/2018   SpO2 99%   BMI 29.18 kg/m  Physical Exam Vitals and nursing note reviewed.  Constitutional:      General: She is not in acute distress.    Appearance: She is well-developed.  HENT:      Head: Normocephalic and atraumatic.     Ears:     Comments: Right-sided TM rupture.  No otomastoid tenderness Eyes:     Conjunctiva/sclera: Conjunctivae normal.  Cardiovascular:     Rate and Rhythm: Normal rate and regular rhythm.     Heart sounds: No murmur heard. Pulmonary:     Effort: Pulmonary effort is normal. No respiratory distress.     Breath sounds: Normal breath sounds.  Abdominal:     General: There is no distension.     Palpations: Abdomen is soft.     Tenderness: There is no abdominal tenderness. There is no right CVA tenderness or left CVA tenderness.  Musculoskeletal:        General: No swelling or tenderness. Normal range of motion.     Cervical back: Neck supple.  Skin:    General: Skin is warm and dry.  Neurological:     General: No focal deficit present.     Mental Status: She is alert and oriented to person, place, and time. Mental status is at baseline.     Cranial Nerves: No cranial nerve deficit.      ED Course/ Medical Decision Making/ A&P    Procedures Procedures   Medications Ordered in ED Medications - No data to display  Medical Decision Making:    Natasha Mcintyre is a 57  y.o. female who presented to the ED today with right ear discomfort detailed above.     Patient's presentation is complicated by their history of multiple comorbid medical problems.  Patient placed on continuous vitals and telemetry monitoring while in ED which was reviewed periodically.   Complete initial physical exam performed, notably the patient  was hemodynamically stable in no acute distress.  No otomastoid tenderness.  Obvious right-sided tympanic membrane rupture with no further drainage or erythema..      Reviewed and confirmed nursing documentation for past medical history, family history, social history.    Initial Assessment:   Patient's history present on his physical exam findings are most consistent with tympanic membrane rupture.  No evidence of  mastoiditis.  Patient's symptoms are less of pain and fever, she states that the symptoms have resolved but it is more side tinnitus and muffled hearing out of this ear.  Ear exam reveals a ruptured tympanic membrane likely etiology of her symptoms.  Will continue her on Ciprodex and have her follow-up with a otolaryngologist within 72 hours. Patient otherwise ambulatory tolerating p.o. intake at this time in no acute distress.  Patient feels comfortable with outpatient care and management.  Disposition:  I have considered need for hospitalization, however, considering all of the above, I believe this patient is stable for discharge at this time.  Patient/family educated about specific return precautions for given chief complaint and symptoms.  Patient/family educated about follow-up with PCP.     Patient/family expressed understanding of return precautions and need for follow-up. Patient spoken to regarding all imaging and laboratory results and appropriate follow up for these results. All education provided in verbal form with additional information in written form. Time was allowed for answering of patient questions. Patient discharged.    Emergency Department Medication Summary:   Medications - No data to display      Clinical Impression:  1. Right ear pain      Discharge   Final Clinical Impression(s) / ED Diagnoses Final diagnoses:  Right ear pain    Rx / DC Orders ED Discharge Orders     None         Tretha Sciara, MD 10/22/22 [phone]

## 2023-01-27 ENCOUNTER — Emergency Department (HOSPITAL_COMMUNITY)
Admission: EM | Admit: 2023-01-27 | Discharge: 2023-01-27 | Disposition: A | Discharge status: Home / Self Care | Payer: BLUE CROSS/BLUE SHIELD | Attending: Emergency Medicine | Admitting: Emergency Medicine

## 2023-01-27 ENCOUNTER — Other Ambulatory Visit: Payer: Self-pay

## 2023-01-27 ENCOUNTER — Encounter (HOSPITAL_COMMUNITY): Payer: Self-pay | Admitting: Emergency Medicine

## 2023-01-27 DIAGNOSIS — Z79899 Other long term (current) drug therapy: Secondary | ICD-10-CM | POA: Diagnosis not present

## 2023-01-27 DIAGNOSIS — L03116 Cellulitis of left lower limb: Secondary | ICD-10-CM | POA: Diagnosis not present

## 2023-01-27 DIAGNOSIS — N1832 Chronic kidney disease, stage 3b: Secondary | ICD-10-CM

## 2023-01-27 DIAGNOSIS — I129 Hypertensive chronic kidney disease with stage 1 through stage 4 chronic kidney disease, or unspecified chronic kidney disease: Secondary | ICD-10-CM | POA: Insufficient documentation

## 2023-01-27 DIAGNOSIS — R2242 Localized swelling, mass and lump, left lower limb: Secondary | ICD-10-CM | POA: Diagnosis present

## 2023-01-27 DIAGNOSIS — M7989 Other specified soft tissue disorders: Secondary | ICD-10-CM

## 2023-01-27 DIAGNOSIS — D649 Anemia, unspecified: Secondary | ICD-10-CM | POA: Diagnosis not present

## 2023-01-27 LAB — CBC WITH DIFFERENTIAL/PLATELET
Abs Immature Granulocytes: 0.01 10*3/uL (ref 0.00–0.07)
Basophils Absolute: 0.1 10*3/uL (ref 0.0–0.1)
Basophils Relative: 2 %
Eosinophils Absolute: 0.5 10*3/uL (ref 0.0–0.5)
Eosinophils Relative: 7 %
HCT: 27.2 % — ABNORMAL LOW (ref 36.0–46.0)
Hemoglobin: 8.8 g/dL — ABNORMAL LOW (ref 12.0–15.0)
Immature Granulocytes: 0 %
Lymphocytes Relative: 27 %
Lymphs Abs: 1.8 10*3/uL (ref 0.7–4.0)
MCH: 29.5 pg (ref 26.0–34.0)
MCHC: 32.4 g/dL (ref 30.0–36.0)
MCV: 91.3 fL (ref 80.0–100.0)
Monocytes Absolute: 0.6 10*3/uL (ref 0.1–1.0)
Monocytes Relative: 8 %
Neutro Abs: 3.9 10*3/uL (ref 1.7–7.7)
Neutrophils Relative %: 56 %
Platelets: 251 10*3/uL (ref 150–400)
RBC: 2.98 MIL/uL — ABNORMAL LOW (ref 3.87–5.11)
RDW: 13.2 % (ref 11.5–15.5)
WBC: 6.9 10*3/uL (ref 4.0–10.5)
nRBC: 0 % (ref 0.0–0.2)

## 2023-01-27 LAB — BASIC METABOLIC PANEL
Anion gap: 8 (ref 5–15)
BUN: 36 mg/dL — ABNORMAL HIGH (ref 6–20)
CO2: 21 mmol/L — ABNORMAL LOW (ref 22–32)
Calcium: 8.5 mg/dL — ABNORMAL LOW (ref 8.9–10.3)
Chloride: 104 mmol/L (ref 98–111)
Creatinine, Ser: 1.6 mg/dL — ABNORMAL HIGH (ref 0.44–1.00)
GFR, Estimated: 37 mL/min — ABNORMAL LOW (ref >60)
Glucose, Bld: 124 mg/dL — ABNORMAL HIGH (ref 70–99)
Potassium: 4.6 mmol/L (ref 3.5–5.1)
Sodium: 133 mmol/L — ABNORMAL LOW (ref 135–145)

## 2023-01-27 LAB — D-DIMER, QUANTITATIVE: D-Dimer, Quant: 1.74 ug/mL-FEU — ABNORMAL HIGH (ref 0.00–0.50)

## 2023-01-27 MED ORDER — APIXABAN 5 MG PO TABS
10.0000 mg | ORAL_TABLET | Freq: Once | ORAL | Status: AC
  Administered 2023-01-27: 10 mg via ORAL
  Filled 2023-01-27: qty 2

## 2023-01-27 MED ORDER — MUPIROCIN CALCIUM 2 % EX CREA
TOPICAL_CREAM | Freq: Once | CUTANEOUS | Status: AC
  Filled 2023-01-27: qty 15

## 2023-01-27 MED ORDER — KETOROLAC TROMETHAMINE 30 MG/ML IJ SOLN
30.0000 mg | Freq: Once | INTRAMUSCULAR | Status: AC
  Administered 2023-01-27: 30 mg via INTRAVENOUS
  Filled 2023-01-27: qty 1

## 2023-01-27 MED ORDER — HYDROCODONE-ACETAMINOPHEN 5-325 MG PO TABS: 1.0000 | ORAL_TABLET | ORAL | 0 refills | Status: DC | PRN

## 2023-01-27 MED ORDER — FERROUS SULFATE 325 (65 FE) MG PO TABS: 325.0000 mg | ORAL_TABLET | Freq: Every day | ORAL | 0 refills | Status: DC

## 2023-01-27 MED ORDER — SODIUM CHLORIDE 0.9 % IV SOLN
1.0000 g | Freq: Once | INTRAVENOUS | Status: AC
  Administered 2023-01-27: 1 g via INTRAVENOUS
  Filled 2023-01-27: qty 10

## 2023-01-27 MED ORDER — CEPHALEXIN 500 MG PO CAPS: 500.0000 mg | ORAL_CAPSULE | Freq: Three times a day (TID) | ORAL | 0 refills | Status: DC

## 2023-01-27 NOTE — Discharge Instructions (Addendum)
It is VERY important that you get the ultrasound done tomorrow.

## 2023-01-27 NOTE — ED Triage Notes (Signed)
Chronic L leg numbness and swelling while awaiting surgery on low back. Because of numbness in L leg develops sores (L groin and thigh) and is now experiencing swelling and redness of L leg and foot for started 3 weeks ago. Was given an abx cream at UC 2 weeks ago. Denies fever, N/V

## 2023-01-27 NOTE — ED Provider Notes (Signed)
[place] EMERGENCY DEPARTMENT AT [facility] Provider Note   CSN: [id number] Arrival date & time: 01/27/23  2120     History  Chief Complaint  Patient presents with   Leg Swelling   Numbness    Natasha Mcintyre is a 57 y.o. female.  Pt is a 57 yo female with pmhx significant for htn, anemia, fibroids, arthritis, and back problems.  Pt has had pain and numbness to left leg for several months.  She has seen pain management and ortho and said there are plans for surgery, although dates have not been set.  Pt has had pain and swelling in her left leg for about 2 weeks.  She has developed sores to her leg as well.  She went to [facility] and they were concerned about DVT, but she refused eval for that.  Pt said none of her shoes fit her left foot b/c of the swelling.  No cp or sob.       Home Medications Prior to Admission medications   Medication Sig Start Date End Date Taking? Authorizing Provider  cephALEXin (KEFLEX) 500 MG capsule Take 1 capsule (500 mg total) by mouth 3 (three) times daily. 01/27/23  Yes Jacalyn Lefevre, MD  ferrous sulfate 325 (65 FE) MG tablet Take 1 tablet (325 mg total) by mouth daily. 01/27/23  Yes Jacalyn Lefevre, MD  HYDROcodone-acetaminophen (NORCO/VICODIN) 5-325 MG tablet Take 1 tablet by mouth every 4 (four) hours as needed. 01/27/23  Yes Jacalyn Lefevre, MD  cyclobenzaprine (FLEXERIL) 10 MG tablet Take 1 tablet (10 mg total) by mouth 2 (two) times daily as needed for muscle spasms. 06/22/20   Farrel Gordon, PA-C  hydrochlorothiazide (HYDRODIURIL) 25 MG tablet Take 1 tablet by mouth daily. 10/02/17   [provider]  lisinopril (PRINIVIL,ZESTRIL) 20 MG tablet Take 20 mg by mouth daily.  08/14/17   [provider]  naproxen (NAPROSYN) 375 MG tablet Take 1 tablet (375 mg total) by mouth 2 (two) times daily. 06/22/20   Farrel Gordon, PA-C      Allergies    Patient has no known allergies.    Review of Systems   Review of Systems   Musculoskeletal:        Left leg pain and swelling  Skin:  Positive for rash.  All other systems reviewed and are negative.   Physical Exam Updated Vital Signs BP 107/67 (BP Location: Right Arm)   Pulse 84   Temp 97.7 F (36.5 C) (Oral)   Resp 18   Wt 81.6 kg   LMP 02/27/2018   SpO2 100%   BMI 30.90 kg/m  Physical Exam Vitals and nursing note reviewed.  Constitutional:      Appearance: Normal appearance.  HENT:     Head: Normocephalic and atraumatic.     Right Ear: External ear normal.     Left Ear: External ear normal.     Nose: Nose normal.     Mouth/Throat:     Mouth: Mucous membranes are moist.     Pharynx: Oropharynx is clear.  Eyes:     Extraocular Movements: Extraocular movements intact.     Conjunctiva/sclera: Conjunctivae normal.     Pupils: Pupils are equal, round, and reactive to light.  Cardiovascular:     Rate and Rhythm: Normal rate and regular rhythm.     Pulses: Normal pulses.     Heart sounds: Normal heart sounds.  Pulmonary:     Effort: Pulmonary effort is normal.     Breath  sounds: Normal breath sounds.  Abdominal:     General: Abdomen is flat. Bowel sounds are normal.     Palpations: Abdomen is soft.  Musculoskeletal:     Cervical back: Normal range of motion and neck supple.     Left lower leg: Edema present.     Comments: LLE edema and swelling up to groin.  Pt does have some open wounds to upper left thigh.   Skin:    General: Skin is warm.     Capillary Refill: Capillary refill takes less than 2 seconds.  Neurological:     General: No focal deficit present.     Mental Status: She is alert and oriented to person, place, and time.  Psychiatric:        Mood and Affect: Mood normal.        Behavior: Behavior normal.     ED Results / Procedures / Treatments   Labs (all labs ordered are listed, but only abnormal results are displayed) Labs Reviewed  BASIC METABOLIC PANEL - Abnormal; Notable for the following components:      Result  Value   Sodium 133 (*)    CO2 21 (*)    Glucose, Bld 124 (*)    BUN 36 (*)    Creatinine, Ser 1.60 (*)    Calcium 8.5 (*)    GFR, Estimated 37 (*)    All other components within normal limits  CBC WITH DIFFERENTIAL/PLATELET - Abnormal; Notable for the following components:   RBC 2.98 (*)    Hemoglobin 8.8 (*)    HCT 27.2 (*)    All other components within normal limits  D-DIMER, QUANTITATIVE - Abnormal; Notable for the following components:   D-Dimer, Quant 1.74 (*)    All other components within normal limits  URINALYSIS, ROUTINE W REFLEX MICROSCOPIC    EKG None  Radiology No results found.  Procedures Procedures    Medications Ordered in ED Medications  apixaban (ELIQUIS) tablet 10 mg (has no administration in time range)  mupirocin cream (BACTROBAN) 2 % (has no administration in time range)  cefTRIAXone (ROCEPHIN) 1 g in sodium chloride 0.9 % 100 mL IVPB (0 g Intravenous Stopped 01/27/23 2323)  ketorolac (TORADOL) 30 MG/ML injection 30 mg (30 mg Intravenous Given 01/27/23 2214)    ED Course/ Medical Decision Making/ A&P                             Medical Decision Making Amount and/or Complexity of Data Reviewed Labs: ordered.  Risk OTC drugs. Prescription drug management.   This patient presents to the ED for concern of leg pain, this involves an extensive number of treatment options, and is a complaint that carries with it a high risk of complications and morbidity.  The differential diagnosis includes DVT, cellulitis, sciatica   Co morbidities that complicate the patient evaluation   htn, anemia, fibroids, arthritis, and back problems   Additional history obtained:  Additional history obtained from epic chart review  Lab Tests:  I Ordered, and personally interpreted labs.  The pertinent results include:  cbc with hgb 8.8 (hgb 10.3 in 2019; no recent labs); ddimer is positive 1.74; bmp with bun 36 and cr 1.6.    Cardiac Monitoring:  The patient  was maintained on a cardiac monitor.  I personally viewed and interpreted the cardiac monitored which showed an underlying rhythm of: nsr   Medicines ordered and prescription drug management:  I  ordered medication including eliquis  for possible dvt  Reevaluation of the patient after these medicines showed that the patient improved I have reviewed the patients home medicines and have made adjustments as needed    Problem List / ED Course:  Left leg pain and swelling:  This could be cellulitis as she has the open wounds.  However,  I strongly suspect DVT.  Unfortunately, [place] is gone for the night.  I am going to give her a dose of Eliquis and have her come back in the morning for [place].  I am going to give her keflex and mupirocin crm. Pt is to f/u with pcp.  Return if worse. Anemia and elevated bun/cr:  I suspect anemia is from ckd.  Pt has no old labs, but I suspect kidney function is chronic.  Pt is told to f/u with pcp.   Reevaluation:  After the interventions noted above, I reevaluated the patient and found that they have :improved   Social Determinants of Health:  Lives at home   Dispostion:  After consideration of the diagnostic results and the patients response to treatment, I feel that the patent would benefit from discharge with outpatient f/u.          Final Clinical Impression(s) / ED Diagnoses Final diagnoses:  Left leg swelling  Anemia, unspecified type  Stage 3b chronic kidney disease (HCC)  Cellulitis of left lower extremity    Rx / DC Orders ED Discharge Orders          Ordered    LE VENOUS        01/27/23 2244    cephALEXin (KEFLEX) 500 MG capsule  3 times daily        01/27/23 2321    HYDROcodone-acetaminophen (NORCO/VICODIN) 5-325 MG tablet  Every 4 hours PRN        01/27/23 2321    ferrous sulfate 325 (65 FE) MG tablet  Daily        01/27/23 2322              Jacalyn Lefevre, MD 01/27/23 2325

## 2023-01-28 ENCOUNTER — Ambulatory Visit (HOSPITAL_COMMUNITY)
Admission: RE | Admit: 2023-01-28 | Discharge: 2023-01-28 | Disposition: A | Discharge status: Home / Self Care | Payer: BLUE CROSS/BLUE SHIELD | Source: Ambulatory Visit | Attending: Emergency Medicine | Admitting: Emergency Medicine

## 2023-01-28 DIAGNOSIS — M7989 Other specified soft tissue disorders: Secondary | ICD-10-CM | POA: Insufficient documentation

## 2023-01-28 NOTE — Progress Notes (Signed)
Left lower extremity venous study completed.   Please see CV Procedures for preliminary results.  Weiland Tomich, RVT  11:26 AM 01/28/23

## 2023-02-19 ENCOUNTER — Other Ambulatory Visit: Payer: Self-pay

## 2023-02-19 ENCOUNTER — Emergency Department (HOSPITAL_COMMUNITY)
Admission: EM | Admit: 2023-02-19 | Discharge: 2023-02-19 | Disposition: A | Discharge status: Home / Self Care | Payer: BLUE CROSS/BLUE SHIELD | Attending: Emergency Medicine | Admitting: Emergency Medicine

## 2023-02-19 DIAGNOSIS — L03116 Cellulitis of left lower limb: Secondary | ICD-10-CM | POA: Diagnosis not present

## 2023-02-19 MED ORDER — CLINDAMYCIN HCL 300 MG PO CAPS
300.0000 mg | ORAL_CAPSULE | Freq: Once | ORAL | Status: AC
  Administered 2023-02-19: 300 mg via ORAL
  Filled 2023-02-19: qty 1

## 2023-02-19 MED ORDER — CLINDAMYCIN HCL 300 MG PO CAPS: 300.0000 mg | ORAL_CAPSULE | Freq: Three times a day (TID) | ORAL | 0 refills | Status: AC

## 2023-02-19 NOTE — Discharge Instructions (Signed)
You have a skin infection.  You may apply antibiotic ointment on it  I have prescribed clindamycin 300 mg 3 times daily for 10 days  Please follow-up with your doctor  Return to ER if you have worse purulent drainage, fever, severe pain

## 2023-02-19 NOTE — ED Triage Notes (Signed)
Pt reports x2 nickel sized wounds on her inner leg that have worsened over a week she relates initially occurred after using an exfoliating wash cloth. Sites are open. Denies abnormal drainage or swelling. Denies hx of diabetes.

## 2023-02-19 NOTE — ED Provider Notes (Signed)
EMERGENCY DEPARTMENT AT [facility] Provider Note   CSN: [id number] Arrival date & time: 02/19/23  1518     History  Chief Complaint  Patient presents with   lt leg wounds    Natasha Mcintyre is a 57 y.o. female history of lumbar stenosis, here presenting with left leg wound.  Patient states that she had previous left leg wound.  She states that she tested negative for MRSA.  She noticed 2 wounds in her left thigh about a week ago.  Patient bought exfoliating wash cloth from [employer] and applied to the wound.  She states that the wound got worse instead of better.  Patient denies any fevers or chills.   The history is provided by the patient.       Home Medications Prior to Admission medications   Medication Sig Start Date End Date Taking? Authorizing Provider  cephALEXin (KEFLEX) 500 MG capsule Take 1 capsule (500 mg total) by mouth 3 (three) times daily. 01/27/23   Jacalyn Lefevre, MD  cyclobenzaprine (FLEXERIL) 10 MG tablet Take 1 tablet (10 mg total) by mouth 2 (two) times daily as needed for muscle spasms. 06/22/20   Farrel Gordon, PA-C  ferrous sulfate 325 (65 FE) MG tablet Take 1 tablet (325 mg total) by mouth daily. 01/27/23   Jacalyn Lefevre, MD  hydrochlorothiazide (HYDRODIURIL) 25 MG tablet Take 1 tablet by mouth daily. 10/02/17   [provider]  HYDROcodone-acetaminophen (NORCO/VICODIN) 5-325 MG tablet Take 1 tablet by mouth every 4 (four) hours as needed. 01/27/23   Jacalyn Lefevre, MD  lisinopril (PRINIVIL,ZESTRIL) 20 MG tablet Take 20 mg by mouth daily.  08/14/17   [provider]  naproxen (NAPROSYN) 375 MG tablet Take 1 tablet (375 mg total) by mouth 2 (two) times daily. 06/22/20   Farrel Gordon, PA-C      Allergies    Patient has no known allergies.    Review of Systems   Review of Systems  Skin:  Positive for wound.  All other systems reviewed and are negative.   Physical Exam Updated Vital Signs BP 128/82 (BP  Location: Left Arm)   Pulse (!) 111   Temp 98.5 F (36.9 C) (Oral)   Resp 18   Ht 5\' 4"  (1.626 m)   Wt 80.3 kg   LMP 02/27/2018   SpO2 100%   BMI 30.38 kg/m  Physical Exam Vitals and nursing note reviewed.  HENT:     Head: Normocephalic.     Nose: Nose normal.     Mouth/Throat:     Mouth: Mucous membranes are moist.  Eyes:     Pupils: Pupils are equal, round, and reactive to light.  Cardiovascular:     Rate and Rhythm: Normal rate and regular rhythm.     Pulses: Normal pulses.     Heart sounds: Normal heart sounds.  Pulmonary:     Effort: Pulmonary effort is normal.     Breath sounds: Normal breath sounds.  Abdominal:     General: Abdomen is flat.     Palpations: Abdomen is soft.  Musculoskeletal:     Cervical back: Normal range of motion and neck supple.  Skin:    Capillary Refill: Capillary refill takes less than 2 seconds.     Comments: Patient has 2 wounds in the left thigh area.  There is some surrounding erythema.  The wounds are draining already.  There is no fluctuance.  Neurological:     General: No focal deficit present.  Mental Status: She is alert.  Psychiatric:        Mood and Affect: Mood normal.        Behavior: Behavior normal.     ED Results / Procedures / Treatments   Labs (all labs ordered are listed, but only abnormal results are displayed) Labs Reviewed - No data to display  EKG None  Radiology No results found.  Procedures Procedures    Medications Ordered in ED Medications  clindamycin (CLEOCIN) capsule 300 mg (300 mg Oral Given 02/19/23 1817)    ED Course/ Medical Decision Making/ A&P                             Medical Decision Making Tamani Durney is a 57 y.o. female here presenting with 2 wounds on the left thigh.  Patient had other wounds in that same area that was crusted over.  Patient does have recurrent skin infection.  Patient states that she never had MRSA.  However given recurrent infections, will try  clindamycin.  I also recommend she can apply Neosporin and avoid exfoliating washcloth   Problems Addressed: Cellulitis of left lower extremity: acute illness or injury  Risk Prescription drug management.    Final Clinical Impression(s) / ED Diagnoses Final diagnoses:  None    Rx / DC Orders ED Discharge Orders     None         Charlynne Pander, MD 02/19/23 [phone]

## 2023-10-14 ENCOUNTER — Other Ambulatory Visit (INDEPENDENT_AMBULATORY_CARE_PROVIDER_SITE_OTHER): Payer: Self-pay

## 2023-10-14 ENCOUNTER — Ambulatory Visit: Admitting: Physician Assistant

## 2023-10-14 ENCOUNTER — Encounter: Payer: Self-pay | Admitting: Physician Assistant

## 2023-10-14 DIAGNOSIS — M170 Bilateral primary osteoarthritis of knee: Secondary | ICD-10-CM

## 2023-10-14 DIAGNOSIS — M25562 Pain in left knee: Secondary | ICD-10-CM

## 2023-10-14 DIAGNOSIS — M1711 Unilateral primary osteoarthritis, right knee: Secondary | ICD-10-CM

## 2023-10-14 DIAGNOSIS — G8929 Other chronic pain: Secondary | ICD-10-CM

## 2023-10-14 DIAGNOSIS — M25561 Pain in right knee: Secondary | ICD-10-CM

## 2023-10-14 DIAGNOSIS — M1712 Unilateral primary osteoarthritis, left knee: Secondary | ICD-10-CM

## 2023-10-14 MED ORDER — LIDOCAINE HCL 1 % IJ SOLN
3.0000 mL | INTRAMUSCULAR | Status: AC | PRN
  Administered 2023-10-14: 3 mL

## 2023-10-14 MED ORDER — METHYLPREDNISOLONE ACETATE 40 MG/ML IJ SUSP
40.0000 mg | INTRAMUSCULAR | Status: AC | PRN
  Administered 2023-10-14: 40 mg via INTRA_ARTICULAR

## 2023-10-14 NOTE — Progress Notes (Deleted)
   Office Visit Note   Patient: Natasha Mcintyre           Date of Birth: 11/17/1965           MRN: [id number] Visit Date: 10/14/2023              Requested by: No referring provider defined for this encounter. PCP: System, Provider Not In   Assessment & Plan: Visit Diagnoses:  1. Chronic pain of both knees   2. Primary osteoarthritis of left knee   3. Primary osteoarthritis of right knee     Plan: ***  Follow-Up Instructions: Return if symptoms worsen or fail to improve.   Orders:  Orders Placed This Encounter  Procedures  . Large Joint Inj: bilateral knee  . XR Knee 1-2 Views Right  . XR Knee 1-2 Views Left   No orders of the defined types were placed in this encounter.     Procedures: No procedures performed   Clinical Data: No additional findings.   Subjective: Chief Complaint  Patient presents with  . Right Knee - Pain  . Left Knee - Pain    HPI  Review of Systems   Objective: Vital Signs: LMP 02/27/2018   Physical Exam  Ortho Exam  Specialty Comments:  No specialty comments available.  Imaging: XR Knee 1-2 Views Left Result Date: 10/14/2023 Left knee 2 views: No acute fractures.  No acute findings.  Moderate to moderately severe lateral compartmental narrowing with osteophytes off the articular margin.  Medial compartment with mild narrowing.  Moderate patellofemoral changes.  XR Knee 1-2 Views Right Result Date: 10/14/2023 Knee: Knee is well located.  Slight valgus deformity.  Moderate to moderately severe lateral compartmental narrowing with osteophytes off the articular margins.  Moderate patellofemoral arthritic changes.  Mild arthritic changes medial compartment.  No acute fractures.    PMFS History: Patient Active Problem List   Diagnosis Date Noted  . Lumbar spinal stenosis 05/06/2018  . Fibroids 10/04/2017  . Anemia 10/04/2017   Past Medical History:  Diagnosis Date  . Anemia 09/09/2017   hgb 10.8  . Arthritis   .  Complication of anesthesia    SHE'S NEVER HAD GENERAL ANESTHESIA  . Fibroids   . Hypertension     Family History  Problem Relation Age of Onset  . Diabetes Mother   . Hypertension Father     Past Surgical History:  Procedure Laterality Date  . BACK SURGERY    . CARPAL TUNNEL RELEASE    . CESAREAN SECTION    . TUBAL LIGATION     Social History   Occupational History  . Not on file  Tobacco Use  . Smoking status: Every Day    Current packs/day: 0.50    Types: Cigarettes  . Smokeless tobacco: Never  Vaping Use  . Vaping status: Never Used  Substance and Sexual Activity  . Alcohol use: Yes    Comment: occ  . Drug use: No  . Sexual activity: Not on file

## 2023-10-14 NOTE — Progress Notes (Signed)
 Office Visit Note   Patient: Natasha Mcintyre           Date of Birth: [birth year]           MRN: [id number] Visit Date: 10/14/2023              Requested by: No referring provider defined for this encounter. PCP: System, Provider Not In   Assessment & Plan: Visit Diagnoses:  1. Chronic pain of both knees     Plan: Continue to work on quad strengthening bilateral knees.  Discussed knee friendly exercises.  Over-the-counter NSAIDs as needed.  She understands to wait at least 3 months between cortisone injections.  Follow-Up Instructions: Return if symptoms worsen or fail to improve.   Orders:  Orders Placed This Encounter  Procedures   Large Joint Inj: bilateral knee   XR Knee 1-2 Views Right   XR Knee 1-2 Views Left   No orders of the defined types were placed in this encounter.     Procedures: Large Joint Inj: bilateral knee on 10/14/2023 2:36 PM Indications: pain Details: 22 G 1.5 in needle, anterolateral approach  Arthrogram: No  Medications (Right): 3 mL lidocaine 1 %; 40 mg methylPREDNISolone acetate 40 MG/ML Medications (Left): 3 mL lidocaine 1 %; 40 mg methylPREDNISolone acetate 40 MG/ML Outcome: tolerated well, no immediate complications Procedure, treatment alternatives, risks and benefits explained, specific risks discussed. Consent was given by the patient. Immediately prior to procedure a time out was called to verify the correct patient, procedure, equipment, support staff and site/side marked as required. Patient was prepped and draped in the usual sterile fashion.       Clinical Data: No additional findings.   Subjective: Chief Complaint  Patient presents with   Right Knee - Pain   Left Knee - Pain    HPI Natasha Mcintyre is 58 year old female who we have not seen in some years.  She comes in today with right greater than left knee pain for at least 1 month.  No known injury.  Pains worse with walking or getting up from his  seated position.  Ranks her right knee pain to be 9 out of 10 in her left knee pain to be 6 out of 10 pain at worst.  Notes some catching and giving way.  No injury to either knee.  She is taking over-the-counter NSAIDs with some relief.  Nondiabetic. Review of Systems No fevers chills or ongoing infections  Objective: Vital Signs: LMP 02/27/2018   Physical Exam Constitutional:      Appearance: She is not ill-appearing or diaphoretic.  Pulmonary:     Effort: Pulmonary effort is normal.  Neurological:     Mental Status: She is alert and oriented to person, place, and time.  Psychiatric:        Mood and Affect: Mood normal.     Ortho Exam Bilateral knees: Good range of motion bilateral knees.  No abnormal warmth erythema of either knee.  Left knee tenderness on the medial lateral joint line.  Patellofemoral crepitus left knee.  Right knee valgus with deformity.  Tenderness over the lateral joint line.  No instability valgus varus stressing of either knee.  Specialty Comments:  No specialty comments available.  Imaging: XR Knee 1-2 Views Left Result Date: 10/14/2023 Left knee 2 views: No acute fractures.  No acute findings.  Moderate to moderately severe lateral compartmental narrowing with osteophytes off the articular margin.  Medial compartment with mild narrowing.  Moderate patellofemoral changes.  XR Knee 1-2 Views Right Result Date: 10/14/2023 Knee: Knee is well located.  Slight valgus deformity.  Moderate to moderately severe lateral compartmental narrowing with osteophytes off the articular margins.  Moderate patellofemoral arthritic changes.  Mild arthritic changes medial compartment.  No acute fractures.    PMFS History: Patient Active Problem List   Diagnosis Date Noted   Lumbar spinal stenosis 05/06/2018   Fibroids 10/04/2017   Anemia 10/04/2017   Past Medical History:  Diagnosis Date   Anemia 09/09/2017   hgb 10.8   Arthritis    Complication of anesthesia     SHE'S NEVER HAD GENERAL ANESTHESIA   Fibroids    Hypertension     Family History  Problem Relation Age of Onset   Diabetes Mother    Hypertension Father     Past Surgical History:  Procedure Laterality Date   BACK SURGERY     CARPAL TUNNEL RELEASE     CESAREAN SECTION     TUBAL LIGATION     Social History   Occupational History   Not on file  Tobacco Use   Smoking status: Every Day    Current packs/day: 0.50    Types: Cigarettes   Smokeless tobacco: Never  Vaping Use   Vaping status: Never Used  Substance and Sexual Activity   Alcohol use: Yes    Comment: occ   Drug use: No   Sexual activity: Not on file
